# Patient Record
Sex: Male | Born: 1980 | Race: White | Hispanic: No | Marital: Married | Smoking: Never smoker
Health system: Southern US, Community
[De-identification: ages and names within clinical notes are randomized; demographics above are authoritative.]

## PROBLEM LIST (undated history)

## (undated) DIAGNOSIS — A5143 Secondary syphilitic oculopathy: Secondary | ICD-10-CM

## (undated) DIAGNOSIS — B20 Human immunodeficiency virus [HIV] disease: Principal | ICD-10-CM

## (undated) HISTORY — DX: Human immunodeficiency virus (HIV) disease: B20

## (undated) HISTORY — PX: WISDOM TOOTH EXTRACTION: SHX21

## (undated) HISTORY — DX: Secondary syphilitic oculopathy: A51.43

---

## 2017-02-01 DIAGNOSIS — B20 Human immunodeficiency virus [HIV] disease: Secondary | ICD-10-CM | POA: Insufficient documentation

## 2017-02-12 ENCOUNTER — Encounter (HOSPITAL_COMMUNITY): Payer: Self-pay | Admitting: General Practice

## 2017-02-12 ENCOUNTER — Inpatient Hospital Stay (HOSPITAL_COMMUNITY)
Admission: AD | Admit: 2017-02-12 | Discharge: 2017-02-14 | DRG: 124 | Disposition: A | Discharge status: Home Health | Payer: BLUE CROSS/BLUE SHIELD | Source: Ambulatory Visit | Attending: Internal Medicine | Admitting: Internal Medicine

## 2017-02-12 ENCOUNTER — Other Ambulatory Visit (HOSPITAL_COMMUNITY)
Admission: RE | Admit: 2017-02-12 | Discharge: 2017-02-12 | Disposition: A | Discharge status: Home / Self Care | Payer: BLUE CROSS/BLUE SHIELD | Source: Ambulatory Visit | Attending: Infectious Disease | Admitting: Infectious Disease

## 2017-02-12 ENCOUNTER — Inpatient Hospital Stay (HOSPITAL_COMMUNITY): Payer: BLUE CROSS/BLUE SHIELD

## 2017-02-12 ENCOUNTER — Other Ambulatory Visit: Payer: Self-pay | Admitting: Pharmacist

## 2017-02-12 ENCOUNTER — Ambulatory Visit (INDEPENDENT_AMBULATORY_CARE_PROVIDER_SITE_OTHER): Payer: BLUE CROSS/BLUE SHIELD | Admitting: Infectious Disease

## 2017-02-12 ENCOUNTER — Encounter: Payer: Self-pay | Admitting: Infectious Disease

## 2017-02-12 VITALS — BP 137/89 | HR 59 | Temp 98.6°F | Ht 72.0 in | Wt 190.0 lb

## 2017-02-12 DIAGNOSIS — A5143 Secondary syphilitic oculopathy: Secondary | ICD-10-CM | POA: Diagnosis not present

## 2017-02-12 DIAGNOSIS — H109 Unspecified conjunctivitis: Secondary | ICD-10-CM | POA: Diagnosis present

## 2017-02-12 DIAGNOSIS — R21 Rash and other nonspecific skin eruption: Secondary | ICD-10-CM | POA: Diagnosis present

## 2017-02-12 DIAGNOSIS — B20 Human immunodeficiency virus [HIV] disease: Secondary | ICD-10-CM | POA: Diagnosis not present

## 2017-02-12 DIAGNOSIS — Z79899 Other long term (current) drug therapy: Secondary | ICD-10-CM | POA: Diagnosis not present

## 2017-02-12 DIAGNOSIS — Z21 Asymptomatic human immunodeficiency virus [HIV] infection status: Secondary | ICD-10-CM | POA: Diagnosis not present

## 2017-02-12 DIAGNOSIS — L989 Disorder of the skin and subcutaneous tissue, unspecified: Secondary | ICD-10-CM | POA: Diagnosis present

## 2017-02-12 DIAGNOSIS — Z95828 Presence of other vascular implants and grafts: Secondary | ICD-10-CM | POA: Diagnosis not present

## 2017-02-12 DIAGNOSIS — H209 Unspecified iridocyclitis: Secondary | ICD-10-CM | POA: Diagnosis present

## 2017-02-12 DIAGNOSIS — R509 Fever, unspecified: Secondary | ICD-10-CM

## 2017-02-12 HISTORY — DX: Human immunodeficiency virus (HIV) disease: B20

## 2017-02-12 HISTORY — DX: Secondary syphilitic oculopathy: A51.43

## 2017-02-12 LAB — TSH: TSH: 0.226 u[IU]/mL — ABNORMAL LOW (ref 0.350–4.500)

## 2017-02-12 LAB — CBC WITH DIFFERENTIAL/PLATELET
BASOS PCT: 1 %
Basophils Absolute: 111 cells/uL (ref 0–200)
EOS PCT: 2 %
Eosinophils Absolute: 222 cells/uL (ref 15–500)
HCT: 43.3 % (ref 38.5–50.0)
Hemoglobin: 14.3 g/dL (ref 13.2–17.1)
LYMPHS PCT: 43 %
Lymphs Abs: 4773 cells/uL — ABNORMAL HIGH (ref 850–3900)
MCH: 26.9 pg — AB (ref 27.0–33.0)
MCHC: 33 g/dL (ref 32.0–36.0)
MCV: 81.5 fL (ref 80.0–100.0)
MONOS PCT: 7 %
MPV: 8.7 fL (ref 7.5–12.5)
Monocytes Absolute: 777 cells/uL (ref 200–950)
NEUTROS ABS: 5217 {cells}/uL (ref 1500–7800)
Neutrophils Relative %: 47 %
PLATELETS: 227 10*3/uL (ref 140–400)
RBC: 5.31 MIL/uL (ref 4.20–5.80)
RDW: 13.9 % (ref 11.0–15.0)
WBC: 11.1 10*3/uL — ABNORMAL HIGH (ref 3.8–10.8)

## 2017-02-12 LAB — URINALYSIS, ROUTINE W REFLEX MICROSCOPIC
Bilirubin Urine: NEGATIVE
GLUCOSE, UA: NEGATIVE mg/dL
Hgb urine dipstick: NEGATIVE
KETONES UR: NEGATIVE mg/dL
LEUKOCYTES UA: NEGATIVE
Nitrite: NEGATIVE
PH: 6 (ref 5.0–8.0)
Protein, ur: NEGATIVE mg/dL
Specific Gravity, Urine: 1.006 (ref 1.005–1.030)

## 2017-02-12 LAB — COMPREHENSIVE METABOLIC PANEL
ALK PHOS: 59 U/L (ref 38–126)
ALT: 13 U/L — AB (ref 17–63)
AST: 18 U/L (ref 15–41)
Albumin: 4 g/dL (ref 3.5–5.0)
Anion gap: 9 (ref 5–15)
BUN: 9 mg/dL (ref 6–20)
CALCIUM: 8.4 mg/dL — AB (ref 8.9–10.3)
CO2: 25 mmol/L (ref 22–32)
CREATININE: 0.9 mg/dL (ref 0.61–1.24)
Chloride: 96 mmol/L — ABNORMAL LOW (ref 101–111)
Glucose, Bld: 122 mg/dL — ABNORMAL HIGH (ref 65–99)
Potassium: 3.4 mmol/L — ABNORMAL LOW (ref 3.5–5.1)
Sodium: 130 mmol/L — ABNORMAL LOW (ref 135–145)
Total Bilirubin: 0.5 mg/dL (ref 0.3–1.2)
Total Protein: 6.9 g/dL (ref 6.5–8.1)

## 2017-02-12 LAB — CBC
HCT: 39.9 % (ref 39.0–52.0)
HEMOGLOBIN: 13.5 g/dL (ref 13.0–17.0)
MCH: 27.8 pg (ref 26.0–34.0)
MCHC: 33.8 g/dL (ref 30.0–36.0)
MCV: 82.1 fL (ref 78.0–100.0)
Platelets: 204 10*3/uL (ref 150–400)
RBC: 4.86 MIL/uL (ref 4.22–5.81)
RDW: 12.5 % (ref 11.5–15.5)
WBC: 11.5 10*3/uL — ABNORMAL HIGH (ref 4.0–10.5)

## 2017-02-12 LAB — MAGNESIUM: Magnesium: 1.8 mg/dL (ref 1.7–2.4)

## 2017-02-12 MED ORDER — ACETAMINOPHEN 650 MG RE SUPP: 650.0000 mg | Freq: Four times a day (QID) | RECTAL | Status: DC | PRN

## 2017-02-12 MED ORDER — SODIUM CHLORIDE 0.9 % IV SOLN
INTRAVENOUS | Status: DC
  Administered 2017-02-12 – 2017-02-13 (×2): via INTRAVENOUS

## 2017-02-12 MED ORDER — SODIUM CHLORIDE 0.9% FLUSH
3.0000 mL | Freq: Two times a day (BID) | INTRAVENOUS | Status: DC
  Administered 2017-02-12 – 2017-02-13 (×2): 3 mL via INTRAVENOUS

## 2017-02-12 MED ORDER — PENICILLIN G POTASSIUM 5000000 UNITS IJ SOLR
4.0000 10*6.[IU] | INTRAMUSCULAR | Status: DC
  Administered 2017-02-12 – 2017-02-14 (×10): 4 10*6.[IU] via INTRAVENOUS
  Filled 2017-02-12 (×16): qty 4

## 2017-02-12 MED ORDER — DOLUTEGRAVIR SODIUM 50 MG PO TABS
50.0000 mg | ORAL_TABLET | Freq: Every day | ORAL | Status: DC
  Administered 2017-02-12 – 2017-02-14 (×3): 50 mg via ORAL
  Filled 2017-02-12 (×3): qty 1

## 2017-02-12 MED ORDER — ENOXAPARIN SODIUM 40 MG/0.4ML ~~LOC~~ SOLN
40.0000 mg | SUBCUTANEOUS | Status: DC
  Administered 2017-02-12 – 2017-02-13 (×2): 40 mg via SUBCUTANEOUS
  Filled 2017-02-12 (×2): qty 0.4

## 2017-02-12 MED ORDER — DOLUTEGRAVIR SODIUM 50 MG PO TABS: 50.0000 mg | ORAL_TABLET | Freq: Every day | ORAL | 5 refills | Status: DC

## 2017-02-12 MED ORDER — ACETAMINOPHEN 325 MG PO TABS: 650.0000 mg | ORAL_TABLET | Freq: Four times a day (QID) | ORAL | Status: DC | PRN

## 2017-02-12 MED ORDER — SODIUM CHLORIDE 0.9% FLUSH
10.0000 mL | INTRAVENOUS | Status: DC | PRN
  Administered 2017-02-13 – 2017-02-14 (×2): 10 mL
  Filled 2017-02-12 (×2): qty 40

## 2017-02-12 MED ORDER — EMTRICITABINE-TENOFOVIR AF 200-25 MG PO TABS
1.0000 | ORAL_TABLET | Freq: Every day | ORAL | Status: DC
  Administered 2017-02-12 – 2017-02-13 (×2): 1 via ORAL
  Filled 2017-02-12 (×3): qty 1

## 2017-02-12 MED ORDER — ONDANSETRON HCL 4 MG/2ML IJ SOLN: 4.0000 mg | Freq: Four times a day (QID) | INTRAMUSCULAR | Status: DC | PRN

## 2017-02-12 MED ORDER — EMTRICITABINE-TENOFOVIR AF 200-25 MG PO TABS: 1.0000 | ORAL_TABLET | Freq: Every day | ORAL | 5 refills | Status: DC

## 2017-02-12 NOTE — Progress Notes (Signed)
Peripherally Inserted Central Catheter/Midline Placement  The IV Nurse has discussed with the patient and/or persons authorized to consent for the patient, the purpose of this procedure and the potential benefits and risks involved with this procedure.  The benefits include less needle sticks, lab draws from the catheter, and the patient may be discharged home with the catheter. Risks include, but not limited to, infection, bleeding, blood clot (thrombus formation), and puncture of an artery; nerve damage and irregular heartbeat and possibility to perform a PICC exchange if needed/ordered by physician.  Alternatives to this procedure were also discussed.  Bard Power PICC patient education guide, fact sheet on infection prevention and patient information card has been provided to patient /or left at bedside.    PICC/Midline Placement Documentation        Lisabeth Devoid 02/12/2017, 6:15 PM Consent obtained by Arlina Robes, RN

## 2017-02-12 NOTE — H&P (Addendum)
[facility] History and Physical  Dalton Rodriguez ZOX:[id number] DOB: 11/20/1980 DOA: 02/12/2017  Referring physician:   PCP: [facility]   Chief Complaint:    HPI:  36 year old homosexual male, seen by Dr. Algis Liming at the new HIV patient, admitted today from infectious disease office for treatment of newly diagnosed syphilitic uveitis and newly diagnosed HIV. Patient has been having cloudy vision since December, with occasional floaters. He also has a rash on his back for the last 2 weeks. He denies any fever, chills. Denies any dysphagia or difficulty swallowing. Denies any urinary symptoms or discharge. Patient admitted as per ID recommendations,He needs PICC and IV continous Penicillin x 2 weeks.       Review of Systems: negative for the following  Constitutional: Denies fever, chills, diaphoresis, appetite change and fatigue.  HEENT: Eyes: Positive for pain and visual disturbance, redness, hearing loss, ear pain, congestion, sore throat, rhinorrhea, sneezing, mouth sores, trouble swallowing, neck pain, neck stiffness and tinnitus.  Respiratory: Denies SOB, DOE, cough, chest tightness, and wheezing.  Cardiovascular: Denies chest pain, palpitations and leg swelling.  Gastrointestinal: Denies nausea, vomiting, abdominal pain, diarrhea, constipation, blood in stool and abdominal distention.  Genitourinary: Denies dysuria, urgency, frequency, hematuria, flank pain and difficulty urinating.  Musculoskeletal: Denies myalgias, back pain, joint swelling, arthralgias and gait problem.  Skin: Denies pallor, rash and wound.  Neurological: Denies dizziness, seizures, syncope, weakness, light-headedness, numbness and headaches.  Hematological: Denies adenopathy. Easy bruising, personal or family bleeding history  Psychiatric/Behavioral: Denies suicidal ideation, mood changes, confusion, nervousness, sleep disturbance and agitation       Past Medical History:  Diagnosis Date   . HIV disease (HCC) 02/12/2017  . 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 WISDOM TOOTH EXTRACTION        Social History:  reports that he has never smoked. He has never used smokeless tobacco. He reports that he drinks alcohol. He reports that he does not use drugs.    No Known Allergies      FAMILY HISTORY  When questioned  Directly-patient reports  No family history of HTN, CVA ,DIABETES, TB, Cancer CAD, Bleeding Disorders, Sickle Cell, diabetes, anemia, asthma,   Prior to Admission medications   Medication Sig Start Date End Date Taking? Authorizing Provider  dolutegravir (TIVICAY) 50 MG tablet Take 1 tablet (50 mg total) by mouth daily. 02/12/17   Cassie L Kuppelweiser, RPH  DUREZOL 0.05 % EMUL  01/27/17   Historical Provider, MD  emtricitabine-tenofovir AF (DESCOVY) 200-25 MG tablet Take 1 tablet by mouth daily. 02/12/17   Cassie L Kuppelweiser, RPH     Physical Exam: Vitals:   02/12/17 1712  BP: (!) 134/93  Pulse: (!) 113  Resp: 17  Temp: (!) 100.7 F (38.2 C)  TempSrc: Oral  SpO2: 100%  Weight: 85.8 kg (189 lb 3.2 oz)  Height: 6' (1.829 m)        Vitals:   02/12/17 1712  BP: (!) 134/93  Pulse: (!) 113  Resp: 17  Temp: (!) 100.7 F (38.2 C)  TempSrc: Oral  SpO2: 100%  Weight: 85.8 kg (189 lb 3.2 oz)  Height: 6' (1.829 m)   Constitutional: NAD, calm, comfortable Eyes: PERRL, lids and conjunctivae normal ENMT: Mucous membranes are moist. Posterior pharynx clear of any exudate or lesions.Normal dentition.  Neck: normal, supple, no masses, no thyromegaly Respiratory: clear to auscultation bilaterally, no wheezing, no crackles. Normal respiratory effort. No accessory muscle use.  Cardiovascular: Regular rate and rhythm, no murmurs / rubs / gallops. No extremity edema. 2+ pedal pulses. No carotid bruits.  Abdomen: no tenderness, no masses palpated. No hepatosplenomegaly. Bowel sounds positive.   Musculoskeletal: no clubbing / cyanosis. No joint deformity upper and lower extremities. Good ROM, no contractures. Normal muscle tone.  Skin: Rash noted., lesions, ulcers. No induration Neurologic: CN 2-12 grossly intact. Sensation intact, DTR normal. Strength 5/5 in all 4.  Psychiatric: Normal judgment and insight. Alert and oriented x 3. Normal mood.     Labs on Admission: I have personally reviewed following labs and imaging studies  CBC: No results for input(s): WBC, NEUTROABS, HGB, HCT, MCV, PLT in the last 168 hours.  Basic Metabolic Panel: No results for input(s): NA, K, CL, CO2, GLUCOSE, BUN, CREATININE, CALCIUM, MG, PHOS in the last 168 hours.  GFR: CrCl cannot be calculated (No order found.).  Liver Function Tests: No results for input(s): AST, ALT, ALKPHOS, BILITOT, PROT, ALBUMIN in the last 168 hours. No results for input(s): LIPASE, AMYLASE in the last 168 hours. No results for input(s): AMMONIA in the last 168 hours.  Coagulation Profile: No results for input(s): INR, PROTIME in the last 168 hours. No results for input(s): DDIMER in the last 72 hours.  Cardiac Enzymes: No results for input(s): CKTOTAL, CKMB, CKMBINDEX, TROPONINI in the last 168 hours.  BNP (last 3 results) No results for input(s): PROBNP in the last 8760 hours.  HbA1C: No results for input(s): HGBA1C in the last 72 hours. No results found for: HGBA1C   CBG: No results for input(s): GLUCAP in the last 168 hours.  Lipid Profile: No results for input(s): CHOL, HDL, LDLCALC, TRIG, CHOLHDL, LDLDIRECT in the last 72 hours.  Thyroid Function Tests: No results for input(s): TSH, T4TOTAL, FREET4, T3FREE, THYROIDAB in the last 72 hours.  Anemia Panel: No results for input(s): VITAMINB12, FOLATE, FERRITIN, TIBC, IRON, RETICCTPCT in the last 72 hours.  Urine analysis: No results found for: COLORURINE, APPEARANCEUR, LABSPEC, PHURINE, GLUCOSEU, HGBUR, BILIRUBINUR, KETONESUR, PROTEINUR,  UROBILINOGEN, NITRITE, LEUKOCYTESUR  Sepsis Labs: (procalcitonin:4,lacticidven:4) )No results found for this or any previous visit (from the past 240 hour(s)).       Radiological Exams on Admission: No results found. No results found.    EKG: Independently reviewed.    Assessment/Plan Principal Problem:   Syphilitic uveitis Infectious disease has ordered PICC line placement ID has also ordered penicillin 2 weeks We'll also check for gonorrhea and chlamydia      HIV disease (HCC)-check CD4 count, HIV RNA genotype, viral load, hepatitis panel     DVT prophylaxis: * Lovenox     Code Status Orders Full code        consults called:  Family Communication: Admission, patients condition and plan of care including tests being ordered have been discussed with the patient  who indicates understanding and agree with the plan and Code Status  Admission status: inpatient    Disposition plan: Further plan will depend as patient's clinical course evolves and further radiologic and laboratory data become available. Likely home when stable   At the time of admission, it appears that the appropriate admission status for this patient is INPATIENT .Thisis judged to be reasonable and necessary in order to provide the required intensity of service to ensure the patient's safetygiven thepresenting symptoms, physical exam findings, and initial radiographic and laboratory data in the context of their chronic comorbidities.   Richarda Overlie MD [facility] Pager [phone]  If 7PM-7AM, please  contact night-coverage [email or web address] Password [facility]  02/12/2017, 5:58 PM

## 2017-02-12 NOTE — Progress Notes (Signed)
Chief complaint: blurry vision  Subjective:    Patient ID: Dalton Rodriguez, male    DOB: 1981-07-31, 36 y.o.   MRN: [id number]  HPI  36 year old Caucasian male with newly diagnosed syphilitic uveitis and newly diagnosed HIV. Patient tells me that he had been suffering from waxing and waning cloudy vision since December. He has pain ion OS>OD. + occasional floaters that subsided. 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 He was worked up with labs and RPR + 1:128 and HIV antibody positive.   He acquired HIV from sex with other men. NO history of IVDU. He had rash that was pruritic and scalng. He also had rash on his back does not recall painless ulcer.   I worked him in urgently so that we could initiate treatment for uveitis = same as for Neurosyphilis with IV PCN.   Past Medical History:  Diagnosis Date  . HIV disease ([place]) 02/12/2017  . 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  No family history on file.    Social History   Social History  . Marital status: Married    Spouse name: N/A  . Number of children: N/A  . Years of education: N/A   Social History Main Topics  . Smoking status: Never Smoker  . Smokeless tobacco: Never Used  . Alcohol use None     Comment: 3-4 drinks a week  . Drug use: No  . Sexual activity: Yes    Partners: Male    Birth control/ protection: Condom   Other Topics Concern  . None   Social History Narrative  . None    No Known Allergies   Current Outpatient Prescriptions:  .  predniSONE (DELTASONE) 20 MG tablet, Take 2 tablets by mouth daily with breakfast for 5 days., Disp: , Rfl:  .  dolutegravir (TIVICAY) 50 MG tablet, Take 1 tablet (50 mg total) by mouth daily., Disp: 30 tablet, Rfl: 5 .  DUREZOL 0.05 % EMUL, , Disp: , Rfl: 0 .  emtricitabine-tenofovir AF (DESCOVY) 200-25 MG tablet, Take 1 tablet by mouth daily., Disp: 30 tablet, Rfl: 5   Review of Systems  Constitutional: Negative for  chills and fever.  HENT: Negative for congestion and sore throat.   Eyes: Positive for pain and visual disturbance. Negative for photophobia.  Respiratory: Negative for cough, shortness of breath and wheezing.   Cardiovascular: Negative for chest pain, palpitations and leg swelling.  Gastrointestinal: Negative for abdominal pain, blood in stool, constipation, diarrhea, nausea and vomiting.  Genitourinary: Negative for dysuria, flank pain and hematuria.  Musculoskeletal: Negative for back pain and myalgias.  Skin: Positive for rash.  Neurological: Negative for dizziness, weakness and headaches.  Hematological: Does not bruise/bleed easily.  Psychiatric/Behavioral: Negative for suicidal ideas.       Objective:   Physical Exam  Constitutional: He is oriented to person, place, and time. He appears well-developed and well-nourished.  HENT:  Head: Normocephalic and atraumatic.  Eyes: Conjunctivae and EOM are normal.  Neck: Normal range of motion. Neck supple.  Cardiovascular: Normal rate, regular rhythm and intact distal pulses.  Exam reveals no gallop and no friction rub.   No murmur heard. Pulmonary/Chest: Effort normal and breath sounds normal. No respiratory distress. He has no wheezes. He has no rales. He exhibits no tenderness.  Abdominal: Soft. Bowel sounds are normal. He exhibits no distension. There is no rebound.  Musculoskeletal: Normal range of motion. He exhibits no edema  or tenderness.  Neurological: He is alert and oriented to person, place, and time.  Skin: Skin is warm and dry. Rash noted. No erythema. No pallor.  Psychiatric: He has a normal mood and affect. His behavior is normal. Judgment and thought content normal.  Vitals reviewed.  Rash with heaped up borders lesions c/w with syphilis:           Assessment & Plan:   Syphilitic uveitis: I dont see strong reason for LP. Treatment is the same. He needs PICC and IV continous Penicillin x 2 weeks  Test for GC  and chlamydia oral rectal and urine  HIV disease: check genotype, viral load, hep panel, HLA basic labs.   Will start on Tivicay and Descovy in house, fill meds at [facility]  Will address vaccines at future visit  We spent greater than 60 minutes with the patient including greater than 50% of time in face to face counsel of the patient re his HIV disease, Syphilitic uveitis, STIS ARV regimen  and in coordination of his care.

## 2017-02-12 NOTE — Progress Notes (Signed)
HPI: Dalton Rodriguez is a 36 y.o. male who presents to the [facility] to see Dr. Daiva Eves as a new HIV patient.   Allergies: No Known Allergies  Past Medical History: Past Medical History:  Diagnosis Date  . HIV disease (HCC) 02/12/2017  . 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 Marital status: Married    Spouse name: N/A  . Number of children: N/A  . Years of education: N/A   Social History Main Topics  . Smoking status: Never Smoker  . Smokeless tobacco: Never Used  . Alcohol use None     Comment: 3-4 drinks a week  . Drug use: No  . Sexual activity: Yes    Partners: Male    Birth control/ protection: Condom   Other Topics Concern  . None   Social History Narrative  . None    Current Regimen: None  Labs: No results found for: HIV1RNAQUANT, HIV1RNAVL, CD4TABS, HEPBSAB, HEPBSAG, HCVAB  CrCl: CrCl cannot be calculated (No order found.).  Lipids: No results found for: CHOL, TRIG, HDL, CHOLHDL, VLDL, LDLCALC  Assessment: Judge is here today to initiate care for his HIV.  Dr. Daiva Eves asked me to speak with him about different medication options.  I talked to him a little bit about HIV and explained the different treatment regimens on the market.  I discussed Narda Rutherford, Odefsey, and Biktarvy.  I also discussed Tivicay and Descovy. He has [employer] and Susanne Borders is not covered by that insurance just yet.  He will start Tivicay and Descovy and transition to [place] once on BCBS formulary. Will try to fill at [facility] but most of the time, BCBS requires specialty pharmacy.  Will send to [facility] in [place] if so.   Plans: - Tivicay 50 mg PO once daily - Descovy 200-25 mg PO once daily  Khalee Mazo L. Ahonesty Woodfin, PharmD, CPP Infectious Diseases Clinical Pharmacist [facility] 02/12/2017, 2:31 PM

## 2017-02-13 DIAGNOSIS — Z95828 Presence of other vascular implants and grafts: Secondary | ICD-10-CM

## 2017-02-13 DIAGNOSIS — B20 Human immunodeficiency virus [HIV] disease: Secondary | ICD-10-CM

## 2017-02-13 DIAGNOSIS — Z21 Asymptomatic human immunodeficiency virus [HIV] infection status: Secondary | ICD-10-CM

## 2017-02-13 DIAGNOSIS — A5143 Secondary syphilitic oculopathy: Principal | ICD-10-CM

## 2017-02-13 LAB — COMPLETE METABOLIC PANEL WITH GFR
ALBUMIN: 4.4 g/dL (ref 3.6–5.1)
ALK PHOS: 67 U/L (ref 40–115)
ALT: 10 U/L (ref 9–46)
AST: 14 U/L (ref 10–40)
BILIRUBIN TOTAL: 0.5 mg/dL (ref 0.2–1.2)
BUN: 12 mg/dL (ref 7–25)
CALCIUM: 8.9 mg/dL (ref 8.6–10.3)
CO2: 23 mmol/L (ref 20–31)
CREATININE: 0.96 mg/dL (ref 0.60–1.35)
Chloride: 98 mmol/L (ref 98–110)
GFR, Est African American: >89 mL/min (ref >=60)
Glucose, Bld: 104 mg/dL — ABNORMAL HIGH (ref 65–99)
Potassium: 3.8 mmol/L (ref 3.5–5.3)
Sodium: 134 mmol/L — ABNORMAL LOW (ref 135–146)
TOTAL PROTEIN: 7.4 g/dL (ref 6.1–8.1)

## 2017-02-13 LAB — GC/CHLAMYDIA PROBE AMP (~~LOC~~) NOT AT ARMC
Chlamydia: NEGATIVE
NEISSERIA GONORRHEA: NEGATIVE

## 2017-02-13 LAB — CBC
HEMATOCRIT: 38.1 % — AB (ref 39.0–52.0)
HEMOGLOBIN: 12.7 g/dL — AB (ref 13.0–17.0)
MCH: 27.4 pg (ref 26.0–34.0)
MCHC: 33.3 g/dL (ref 30.0–36.0)
MCV: 82.1 fL (ref 78.0–100.0)
Platelets: 210 10*3/uL (ref 150–400)
RBC: 4.64 MIL/uL (ref 4.22–5.81)
RDW: 12.9 % (ref 11.5–15.5)
WBC: 11.3 10*3/uL — ABNORMAL HIGH (ref 4.0–10.5)

## 2017-02-13 LAB — HEPATITIS B SURFACE ANTIGEN: Hepatitis B Surface Ag: NEGATIVE

## 2017-02-13 LAB — HEPATITIS C ANTIBODY: HCV Ab: NEGATIVE

## 2017-02-13 LAB — T-HELPER CELL (CD4) - (RCID CLINIC ONLY)
CD4 T CELL ABS: 560 /uL (ref 400–2700)
CD4 T CELL HELPER: 11 % — AB (ref 33–55)

## 2017-02-13 LAB — HEPATITIS A ANTIBODY, TOTAL: Hep A Total Ab: NONREACTIVE

## 2017-02-13 LAB — HEPATITIS B SURFACE ANTIBODY,QUALITATIVE: HEP B S AB: POSITIVE — AB

## 2017-02-13 MED ORDER — PENICILLIN G POTASSIUM IV (FOR PTA / DISCHARGE USE ONLY): 24.0000 10*6.[IU] | INTRAVENOUS | 0 refills | Status: DC

## 2017-02-13 MED FILL — DESCOVY 200-25 MG TABS: 200-25 | 30 days supply | Qty: 30 | Fill #0

## 2017-02-13 NOTE — Addendum Note (Signed)
Addended by: Mariea Clonts D on: 02/13/2017 09:38 AM   Modules accepted: Orders

## 2017-02-13 NOTE — Progress Notes (Signed)
[facility]  New pt for [facility] this hospital admission.  [facility] will provide [facility] services for home IV ABX.  If patient discharges after hours, please call [phone].   Sedalia Muta 02/13/2017, 12:20 PM

## 2017-02-13 NOTE — Progress Notes (Addendum)
PHARMACY CONSULT NOTE FOR:  OUTPATIENT  PARENTERAL ANTIBIOTIC THERAPY (OPAT)  Indication: ocular syphilis Regimen: penicillin G IV 24 million units continuous over 24 hours End date: 02/26/2017 to complete 14 full days  IV antibiotic discharge orders are pended. To discharging provider:  please sign these orders via discharge navigator,  Select New Orders & click on the button choice - Manage This Unsigned Work.     Thank you for allowing pharmacy to be a part of this patient's care.  Reonna Finlayson D. Cheston Coury, PharmD, BCPS Clinical Pharmacist Pager: [phone] 02/13/2017 2:47 PM

## 2017-02-13 NOTE — Care Management Note (Addendum)
Case Management Note  Patient Details  Name: Dalton Rodriguez MRN: [id number] Date of Birth: Mar 16, 1981  Subjective/Objective:      CM following for progression and d/c planning.               Action/Plan: 02/13/2017 Met with pt re IV antibiotics to be administered at home. This CM explained plan to pt and contacted Carolynn Sayers of [facility], who will see this pt today to arrange for [facility] and IV antibiotics. Await prescriptions.  4pm pt has received HIV meds, IV Penicillin arranged by Harriet [place] with [facility] to provide teaching . Pt ready for d/c to home.  Expected Discharge Date:    02/13/2017              Expected Discharge Plan:  [place]  In-House Referral:  Clinical Social Work  Discharge planning Services  CM Consult  Post Acute Care Choice:  Durable Medical Equipment, Home Health Choice offered to:  Patient  DME Arranged:  IV pump/equipment DME Agency:  [place] Arranged:  RN [facility] Agency:  [place]  Status of Service:  Completed, signed off  If discussed at [place] of Stay Meetings, dates discussed:    Additional Comments:  Adron Bene, RN 02/13/2017, 11:40 AM

## 2017-02-13 NOTE — Progress Notes (Signed)
PROGRESS NOTE    Dalton Rodriguez  ZOX:[id number] DOB: Sep 20, 1981 DOA: 02/12/2017 PCP: [facility]    Brief Narrative:  36 yo male with new diagnosis HIV, complicated with syphilis uveitis. Initially evaluated at the outpatient ID office, referred for IV antibiotic therapy. Positive skin lesions at the upper back. HIV viral load and genotyping pending.    Assessment & Plan:   Principal Problem:   Syphilitic uveitis Active Problems:   HIV disease (HCC)   Uveitis   1. Syphilitic uveitis. Patient has been started on IV penicillin, consulted pharmacy for outpatient antibiotic therapy. Will need 2 weeks continuous therapy. Clinically patient with persistent uveitis.   2. New diagnosed HIV (AIDS). Negative for chlamydia and neisseria. Will continue antiretroviral agents, with tivicay and descovy. Social services consulted for outpatient follow up.    DVT prophylaxis: enoxaparin  Code Status: full  Family Communication: no family at the bedside Disposition Plan: home   Consultants:   Infectious disease  Procedures:     Antimicrobials:   Penicillin   descovy  Tivicay   Subjective: Patient feeling better, no blurry vision, no nausea or vomiting. Picc line has been placed.   Objective: Vitals:   02/12/17 1712 02/12/17 2025 02/13/17 0448 02/13/17 0859  BP: (!) 134/93 130/82 125/65 119/74  Pulse: (!) 113 (!) 113 (!) 118 (!) 101  Resp: Temp: (!) 100.7 F (38.2 C) 98.7 F (37.1 C) 98.8 F (37.1 C) 98.1 F (36.7 C)  TempSrc: Oral Axillary Oral Oral  SpO2: 100% 98% 96% 97%  Weight: 85.8 kg (189 lb 3.2 oz) 85.9 kg (189 lb 6 oz)    Height: 6' (1.829 m)       Intake/Output Summary (Last 24 hours) at 02/13/17 1203 Last data filed at 02/13/17 1146  Gross per 24 hour  Intake              130 ml  Output             2425 ml  Net            -2295 ml   Filed Weights   02/12/17 1712 02/12/17 2025  Weight: 85.8 kg (189 lb 3.2 oz) 85.9 kg (189 lb 6  oz)    Examination:  General exam: not in pain or dyspnea E ENT: no pallor or icterus. Positive conjunctival erythema bilateral.  Respiratory system: Clear to auscultation. Respiratory effort normal. Cardiovascular system: S1 & S2 heard, RRR. No JVD, murmurs, rubs, gallops or clicks. No pedal edema. Gastrointestinal system: Abdomen is nondistended, soft and nontender. No organomegaly or masses felt. Normal bowel sounds heard. Central nervous system: Alert and oriented. No focal neurological deficits. Extremities: Symmetric 5 x 5 power. Skin: plaques at the upper back, well defined margin. No erythema or purulence.      Data Reviewed: I have personally reviewed following labs and imaging studies  CBC:  Recent Labs Lab 02/12/17 1523 02/12/17 1916 02/13/17 0436  WBC 11.1* 11.5* 11.3*  NEUTROABS 5,217  --   --   HGB 14.3 13.5 12.7*  HCT 43.3 39.9 38.1*  MCV 81.5 82.1 82.1  PLT 227 204 210   Basic Metabolic Panel:  Recent Labs Lab 02/12/17 1523 02/12/17 1906 02/12/17 1916  NA 134*  --  130*  K 3.8  --  3.4*  CL 98  --  96*  CO2 23  --  25  GLUCOSE 104*  --  122*  BUN 12  --  9  CREATININE 0.96  --  0.90  CALCIUM 8.9  --  8.4*  MG  --  1.8  --    GFR: Estimated Creatinine Clearance: 126.9 mL/min (by C-G formula based on SCr of 0.9 mg/dL). Liver Function Tests:  Recent Labs Lab 02/12/17 1523 02/12/17 1916  AST 14 18  ALT 10 13*  ALKPHOS 67 59  BILITOT 0.5 0.5  PROT 7.4 6.9  ALBUMIN 4.4 4.0   No results for input(s): LIPASE, AMYLASE in the last 168 hours. No results for input(s): AMMONIA in the last 168 hours. Coagulation Profile: No results for input(s): INR, PROTIME in the last 168 hours. Cardiac Enzymes: No results for input(s): CKTOTAL, CKMB, CKMBINDEX, TROPONINI in the last 168 hours. BNP (last 3 results) No results for input(s): PROBNP in the last 8760 hours. HbA1C: No results for input(s): HGBA1C in the last 72 hours. CBG: No results for  input(s): GLUCAP in the last 168 hours. Lipid Profile: No results for input(s): CHOL, HDL, LDLCALC, TRIG, CHOLHDL, LDLDIRECT in the last 72 hours. Thyroid Function Tests:  Recent Labs  02/12/17 1907  TSH 0.226*   Anemia Panel: No results for input(s): VITAMINB12, FOLATE, FERRITIN, TIBC, IRON, RETICCTPCT in the last 72 hours. Sepsis Labs: No results for input(s): PROCALCITON, LATICACIDVEN in the last 168 hours.  Recent Results (from the past 240 hour(s))  Culture, blood (routine x 2)     Status: None (Preliminary result)   Collection Time: 02/12/17  7:07 PM  Result Value Ref Range Status   Specimen Description BLOOD RIGHT ANTECUBITAL  Final   Special Requests   Final    BOTTLES DRAWN AEROBIC AND ANAEROBIC Blood Culture adequate volume   Culture NO GROWTH < 24 HOURS  Final   Report Status PENDING  Incomplete  Culture, blood (routine x 2)     Status: None (Preliminary result)   Collection Time: 02/12/17  7:17 PM  Result Value Ref Range Status   Specimen Description BLOOD LEFT HAND  Final   Special Requests   Final    BOTTLES DRAWN AEROBIC AND ANAEROBIC Blood Culture adequate volume   Culture NO GROWTH < 24 HOURS  Final   Report Status PENDING  Incomplete         Radiology Studies: Portable Chest 1 View  Result Date: 02/12/2017 CLINICAL DATA:  36 y/o  M; fever.  PICC placement. EXAM: PORTABLE CHEST 1 VIEW COMPARISON:  None. FINDINGS: Right PICC line tip projects over the lower SVC. Normal cardiac silhouette given projection and technique. Clear lungs. No pleural effusion or pneumothorax. No acute osseous abnormality is evident. IMPRESSION: No active disease.  PICC line tip projects over lower SVC. Electronically Signed   By: Mitzi Hansen M.D.   On: 02/12/2017 22:27        Scheduled Meds: . dolutegravir  50 mg Oral Daily  . emtricitabine-tenofovir AF  1 tablet Oral Daily  . enoxaparin (LOVENOX) injection  40 mg Subcutaneous Q24H  . pencillin G potassium IV   4 Million Units Intravenous Q4H  . sodium chloride flush  3 mL Intravenous Q12H   Continuous Infusions: . sodium chloride 75 mL/hr at 02/12/17 1900     LOS: 1 day       Roneshia Drew Annett Gula, MD [facility] Pager [phone]  If 7PM-7AM, please contact night-coverage [email or web address] Password [facility] 02/13/2017, 12:03 PM

## 2017-02-13 NOTE — Addendum Note (Signed)
Addended by: Mariea Clonts D on: 02/13/2017 09:28 AM   Modules accepted: Orders

## 2017-02-13 NOTE — Progress Notes (Signed)
Subjective: No new complaints, he believes the vision is slightly better.   Antibiotics:  Anti-infectives    Start     Dose/Rate Route Frequency Ordered Stop   02/13/17 0000  penicillin G IVPB     24 Million Units Intravenous Continuous 02/13/17 1516     02/12/17 2100  dolutegravir (TIVICAY) tablet 50 mg     50 mg Oral Daily 02/12/17 2011     02/12/17 2100  emtricitabine-tenofovir AF (DESCOVY) 200-25 MG per tablet 1 tablet     1 tablet Oral Daily 02/12/17 2011     02/12/17 1800  penicillin G potassium 4 Million Units in dextrose 5 % 250 mL IVPB     4 Million Units 250 mL/hr over 60 Minutes Intravenous Every 4 hours 02/12/17 1710        Medications: Scheduled Meds: . dolutegravir  50 mg Oral Daily  . emtricitabine-tenofovir AF  1 tablet Oral Daily  . enoxaparin (LOVENOX) injection  40 mg Subcutaneous Q24H  . pencillin G potassium IV  4 Million Units Intravenous Q4H  . sodium chloride flush  3 mL Intravenous Q12H   Continuous Infusions: . sodium chloride 75 mL/hr at 02/13/17 1542   PRN Meds:.acetaminophen **OR** acetaminophen, ondansetron (ZOFRAN) IV, sodium chloride flush    Objective: Weight change:   Intake/Output Summary (Last 24 hours) at 02/13/17 1922 Last data filed at 02/13/17 1429  Gross per 24 hour  Intake              250 ml  Output             2875 ml  Net            -2625 ml   Blood pressure 124/78, pulse 93, temperature 98.3 F (36.8 C), temperature source Oral, resp. rate 13, height 6' (1.829 m), weight 189 lb 6 oz (85.9 kg), SpO2 97 %. Temp:  [98.1 F (36.7 C)-98.8 F (37.1 C)] 98.3 F (36.8 C) (04/13 1548) Pulse Rate:  [93-118] 93 (04/13 1548) Resp:  [12-16] 13 (04/13 1548) BP: (119-130)/(65-82) 124/78 (04/13 1548) SpO2:  [96 %-98 %] 97 % (04/13 1548) Weight:  [189 lb 6 oz (85.9 kg)] 189 lb 6 oz (85.9 kg) (04/12 2025)  Physical Exam: General: Alert and awake, oriented x3, not in any acute distress. HEENT: anicteric sclera, ,  EOMI CVS regular rate, normal r,   Chest:no wheezing, or respiratory distress Abdomen: soft , nondistended, normal bowel sounds, Extremities: no  clubbing or edema noted bilaterally Skin: rash from yesterday not examined today Neuro: nonfocal  CBC:    BMET  Recent Labs  02/12/17 1523 02/12/17 1916  NA 134* 130*  K 3.8 3.4*  CL 98 96*  CO2 23 25  GLUCOSE 104* 122*  BUN 12 9  CREATININE 0.96 0.90  CALCIUM 8.9 8.4*     Liver Panel   Recent Labs  02/12/17 1523 02/12/17 1916  PROT 7.4 6.9  ALBUMIN 4.4 4.0  AST 14 18  ALT 10 13*  ALKPHOS 67 59  BILITOT 0.5 0.5       Sedimentation Rate No results for input(s): ESRSEDRATE in the last 72 hours. C-Reactive Protein No results for input(s): CRP in the last 72 hours.  Micro Results: Recent Results (from the past 720 hour(s))  Culture, blood (routine x 2)     Status: None (Preliminary result)   Collection Time: 02/12/17  7:07 PM  Result Value Ref Range Status   Specimen Description  BLOOD RIGHT ANTECUBITAL  Final   Special Requests   Final    BOTTLES DRAWN AEROBIC AND ANAEROBIC Blood Culture adequate volume   Culture NO GROWTH < 24 HOURS  Final   Report Status PENDING  Incomplete  Culture, blood (routine x 2)     Status: None (Preliminary result)   Collection Time: 02/12/17  7:17 PM  Result Value Ref Range Status   Specimen Description BLOOD LEFT HAND  Final   Special Requests   Final    BOTTLES DRAWN AEROBIC AND ANAEROBIC Blood Culture adequate volume   Culture NO GROWTH < 24 HOURS  Final   Report Status PENDING  Incomplete    Studies/Results: Portable Chest 1 View  Result Date: 02/12/2017 CLINICAL DATA:  36 y/o  M; fever.  PICC placement. EXAM: PORTABLE CHEST 1 VIEW COMPARISON:  None. FINDINGS: Right PICC line tip projects over the lower SVC. Normal cardiac silhouette given projection and technique. Clear lungs. No pleural effusion or pneumothorax. No acute osseous abnormality is evident. IMPRESSION: No  active disease.  PICC line tip projects over lower SVC. Electronically Signed   By: Kristine Garbe M.D.   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 male with  with syphilis causing uveitis and newly diagnosed HIV disease admitted by me from clinic yesterday for placement of PICC line and initiation of IV penicillin.  #1 Syphilis causing uveitis: He needs 24 million units of penicillin per day this can be set up as a continuous infusion with [facility].  #2 HIV disease continue TIVICAY and DESCOVY. One month's worth of medications have been delivered to the patient's bedside.  We will follow-up all his labs in our clinic.  IV antibiotic as follows  Diagnosis: Syphilitic  uveitis  Culture Result: RPR of 1:128  No Known Allergies  Discharge antibiotics: IV pencillin via continuous infusion 24 million units daily x 2 weeks   Duration: 2 weeks   End Date:  02/26/17    [facility] Care Per Protocol:  Labs weekly while on IV antibiotics: _x_ CBC with differential x_ BMP   x__ Please pull PIC at completion of IV antibiotics __ Please leave PIC in place until doctor has seen patient or been notified  Fax weekly labs to [phone]  Clinic Follow Up Appt:  Next 2 weeks with myself vs ID pharmacy to review his HIV labs and ensure that he is tolerating his ARV regimen very well and to consider staying on current regimen vs change to [place] if HLA B701 negative.       LOS: 1 day   Alcide Evener 02/13/2017, 7:22 PM

## 2017-02-13 NOTE — Progress Notes (Signed)
[facility]  Notified by pt around 445 PM that MD will keep one more day.  [facility] has IV ABX for home ready and are prepared to support DC home on Saturday once DC confirmed.  If patient discharges after hours, please call [phone].   Sedalia Muta 02/13/2017, 5:53 PM

## 2017-02-13 NOTE — Progress Notes (Signed)
Patient counseled on Tivicay and Descovy over the phone. Family doesn't know about diagnosis. Nurse has meds to be started at discharge.   Alfredo Bach, Cleotis Nipper, PharmD Clinical Pharmacy Resident [phone] (Pager) 02/13/2017 3:41 PM

## 2017-02-14 DIAGNOSIS — H209 Unspecified iridocyclitis: Secondary | ICD-10-CM

## 2017-02-14 LAB — BASIC METABOLIC PANEL
Anion gap: 8 (ref 5–15)
BUN: 5 mg/dL — AB (ref 6–20)
CO2: 27 mmol/L (ref 22–32)
Calcium: 8.3 mg/dL — ABNORMAL LOW (ref 8.9–10.3)
Chloride: 99 mmol/L — ABNORMAL LOW (ref 101–111)
Creatinine, Ser: 0.76 mg/dL (ref 0.61–1.24)
GFR calc non Af Amer: >60 mL/min (ref >60)
Glucose, Bld: 132 mg/dL — ABNORMAL HIGH (ref 65–99)
Potassium: 3.2 mmol/L — ABNORMAL LOW (ref 3.5–5.1)
SODIUM: 134 mmol/L — AB (ref 135–145)

## 2017-02-14 LAB — CBC WITH DIFFERENTIAL/PLATELET
BASOS ABS: 0 10*3/uL (ref 0.0–0.1)
BASOS PCT: 0 %
EOS ABS: 0.3 10*3/uL (ref 0.0–0.7)
Eosinophils Relative: 4 %
HCT: 37.4 % — ABNORMAL LOW (ref 39.0–52.0)
HEMOGLOBIN: 12.7 g/dL — AB (ref 13.0–17.0)
LYMPHS ABS: 4 10*3/uL (ref 0.7–4.0)
LYMPHS PCT: 54 %
MCH: 27.7 pg (ref 26.0–34.0)
MCHC: 34 g/dL (ref 30.0–36.0)
MCV: 81.5 fL (ref 78.0–100.0)
MONO ABS: 0.7 10*3/uL (ref 0.1–1.0)
Monocytes Relative: 9 %
NEUTROS ABS: 2.4 10*3/uL (ref 1.7–7.7)
Neutrophils Relative %: 33 %
PLATELETS: 178 10*3/uL (ref 150–400)
RBC: 4.59 MIL/uL (ref 4.22–5.81)
RDW: 12.7 % (ref 11.5–15.5)
WBC: 7.4 10*3/uL (ref 4.0–10.5)

## 2017-02-14 MED ORDER — HEPARIN SOD (PORK) LOCK FLUSH 100 UNIT/ML IV SOLN
250.0000 [IU] | INTRAVENOUS | Status: AC | PRN
  Administered 2017-02-14: 250 [IU]

## 2017-02-14 NOTE — Discharge Summary (Signed)
Physician Discharge Summary  Dalton Rodriguez KKX:[id number] DOB: June 07, 1981 DOA: 02/12/2017  PCP: [place] date: 02/12/2017 Discharge date: 02/14/2017  Admitted From: Home Disposition:  Home   Recommendations for Outpatient Follow-up:  1. Follow up with Infectious Disease Clinic in 2 weeks 2. Weekly while on IV antibiotics: CBC with differential and BMP 3. To pull PIC at completion of IV antibiotics.  4. Pending result: HIV 1 RNA PCR, HLA B 5701   Home Health: Home infusion for antibiotics.  Equipment/Devices: NA  Discharge Condition: Stable  CODE STATUS: Full  Diet recommendation: Regular.   Brief/Interim Summary: This is a 36 year old male who was admitted to the hospital from the infectious disease clinic to start IV penicillin for a new diagnosis of syphilis causing uveitis. Patient complain of claudication for last 4 months, rash in the upper back for last 2 weeks no fevers or chills. His RPR was positive 1:128. Patient tested positive for HIV. On initial physical examination blood pressure 134/93, heart rate 113, respiratory rate 17, temperature 100.7, oxygen saturation 100%. Positive conjunctival erythema, rash on the upper back, round plaques. Sodium 130, potassium 3.4, chloride 96, bicarbonate 25, glucose 122, BUN 9, creatinine 0.90, white count 11.5, hemoglobin 13.5, hematocrit 39.9, platelets 204. Urine analysis negative for infection.  The patient was admitted to the hospital working diagnosis of syphilis causing uveitis and you diagnosis of HIV/ AIDS.  1. Syphilis causing conjunctivitis. Patient was admitted to the medical floor, IV penicillin was started with good toleration. Patient had a PICC line placed with the plan to continue antibiotics for 2 weeks. 24 million units of penicillin per day as a continuous infusion with [facility]. Follow up with the ID clinic in 2 weeks with end date 02/26/2017. Will need weekly CBC with differential and BMP.    2. Newly diagnosed HIV- AIDS. Patient was started on antiretroviral therapy with TIVICAY and DESCOVY. HIV 1 RNA PCR and HLA B 5701 pending. Chlamydia and Neisseria were negative, hep C antibody negative, hep B surface antibody positive, hep B surface antigen negative. CD4 T Cell, absolute 560 with CD4 % Helper T cell 11. Vaccination at the infection to this clinic.    Discharge Diagnoses:  Principal Problem:   Syphilitic uveitis Active Problems:   HIV disease [facility])   Uveitis    Discharge Instructions  Discharge Instructions    Home infusion instructions [facility] May follow [place] Dosing Protocol; May administer Cathflo as needed to maintain patency of vascular access device.; Flushing of vascular access device: per [facility] Protocol: 0.9% NaCl pre/post medica...    Complete by:  As directed    Instructions:  May follow [place] Dosing Protocol   Instructions:  May administer Cathflo as needed to maintain patency of vascular access device.   Instructions:  Flushing of vascular access device: per [facility] Protocol: 0.9% NaCl pre/post medication administration and prn patency; Heparin 100 u/ml, 50m for implanted ports and Heparin 10u/ml, 562mfor all other central venous catheters.   Instructions:  May follow [facility] Anaphylaxis Protocol for First Dose Administration in the home: 0.9% NaCl at 25-50 ml/hr to maintain IV access for protocol meds. Epinephrine 0.3 ml IV/IM PRN and Benadryl 25-50 IV/IM PRN s/s of anaphylaxis.   Instructions:  [facility] Coordinator (RN) to assist per patient IV care needs in the home PRN.     Allergies as of 02/14/2017   No Known Allergies     Medication List  STOP taking these medications   ibuprofen 200 MG tablet Commonly known as:  ADVIL,MOTRIN     TAKE these medications   dolutegravir 50 MG tablet Commonly known as:  TIVICAY Take 1 tablet (50 mg total) by mouth daily.   DUREZOL 0.05 % Emul Generic drug:  Difluprednate    emtricitabine-tenofovir AF 200-25 MG tablet Commonly known as:  DESCOVY Take 1 tablet by mouth daily.   penicillin G IVPB Inject 24 Million Units into the vein continuous. Indication:  Ocular syphilis Last Day of Therapy:  4/26 Labs - Once weekly:  CBC/D and BMP, Labs - Every other week:  ESR and CRP            Home Infusion Instuctions        Start     Ordered   02/13/17 0000  Home infusion instructions [facility] May follow [place] Dosing Protocol; May administer Cathflo as needed to maintain patency of vascular access device.; Flushing of vascular access device: per [facility] Protocol: 0.9% NaCl pre/post medica...    Question Answer Comment  Instructions May follow [place] Dosing Protocol   Instructions May administer Cathflo as needed to maintain patency of vascular access device.   Instructions Flushing of vascular access device: per [facility] Protocol: 0.9% NaCl pre/post medication administration and prn patency; Heparin 100 u/ml, 83m for implanted ports and Heparin 10u/ml, 537mfor all other central venous catheters.   Instructions May follow [facility] Anaphylaxis Protocol for First Dose Administration in the home: 0.9% NaCl at 25-50 ml/hr to maintain IV access for protocol meds. Epinephrine 0.3 ml IV/IM PRN and Benadryl 25-50 IV/IM PRN s/s of anaphylaxis.   Instructions [facility] Infusion Coordinator (RN) to assist per patient IV care needs in the home PRN.      02/13/17 1516      No Known Allergies  Consultations:  Infectious disease   Procedures/Studies: Portable Chest 1 View  Result Date: 02/12/2017 CLINICAL DATA:  [age]/o  M; fever.  PICC placement. EXAM: PORTABLE CHEST 1 VIEW COMPARISON:  None. FINDINGS: Right PICC line tip projects over the lower SVC. Normal cardiac silhouette given projection and technique. Clear lungs. No pleural effusion or pneumothorax. No acute osseous abnormality is evident. IMPRESSION: No active disease.  PICC line tip projects  over lower SVC. Electronically Signed   By: LaKristine Garbe.D.   On: 02/12/2017 22:27       Subjective: Patient feeling better, no nausea or vomiting, no blurry vision.   Discharge Exam: Vitals:   02/14/17 0518 02/14/17 0903  BP: 123/76 126/81  Pulse: 77 83  Resp: 16 18  Temp: 98 F (36.7 C) 98.2 F (36.8 C)   Vitals:   02/13/17 2117 02/13/17 2304 02/14/17 0518 02/14/17 0903  BP: 136/86  123/76 126/81  Pulse: 89  77 83  Resp: _0 Temp: 98 F (36.7 C)  98 F (36.7 C) 98.2 F (36.8 C)  TempSrc: Oral  Oral Oral  SpO2: 97%  98% 98%  Weight:  86.2 kg (190 lb 1.6 oz)    Height:        General: Pt is alert, awake, not in acute distress. Positive conjunctival erythema.  Cardiovascular: RRR, S1/S2 +, no rubs, no gallops Respiratory: CTA bilaterally, no wheezing, no rhonchi Abdominal: Soft, NT, ND, bowel sounds + Extremities: no edema, no cyanosis Skin. Positive rash on the upper back, round plaques.     The results of significant diagnostics from this hospitalization (  including imaging, microbiology, ancillary and laboratory) are listed below for reference.     Microbiology: Recent Results (from the past 240 hour(s))  Culture, blood (routine x 2)     Status: None (Preliminary result)   Collection Time: 02/12/17  7:07 PM  Result Value Ref Range Status   Specimen Description BLOOD RIGHT ANTECUBITAL  Final   Special Requests   Final    BOTTLES DRAWN AEROBIC AND ANAEROBIC Blood Culture adequate volume   Culture NO GROWTH < 24 HOURS  Final   Report Status PENDING  Incomplete  Culture, blood (routine x 2)     Status: None (Preliminary result)   Collection Time: 02/12/17  7:17 PM  Result Value Ref Range Status   Specimen Description BLOOD LEFT HAND  Final   Special Requests   Final    BOTTLES DRAWN AEROBIC AND ANAEROBIC Blood Culture adequate volume   Culture NO GROWTH < 24 HOURS  Final   Report Status PENDING  Incomplete     Labs: BNP (last 3  results) No results for input(s): BNP in the last 8760 hours. Basic Metabolic Panel:  Recent Labs Lab 02/12/17 1523 02/12/17 1906 02/12/17 1916 02/14/17 0446  NA 134*  --  130* 134*  K 3.8  --  3.4* 3.2*  CL 98  --  96* 99*  CO2 23  --  25 27  GLUCOSE 104*  --  122* 132*  BUN 12  --  9 5*  CREATININE 0.96  --  0.90 0.76  CALCIUM 8.9  --  8.4* 8.3*  MG  --  1.8  --   --    Liver Function Tests:  Recent Labs Lab 02/12/17 1523 02/12/17 1916  AST 14 18  ALT 10 13*  ALKPHOS 67 59  BILITOT 0.5 0.5  PROT 7.4 6.9  ALBUMIN 4.4 4.0   No results for input(s): LIPASE, AMYLASE in the last 168 hours. No results for input(s): AMMONIA in the last 168 hours. CBC:  Recent Labs Lab 02/12/17 1523 02/12/17 1916 02/13/17 0436 02/14/17 0446  WBC 11.1* 11.5* 11.3* 7.4  NEUTROABS 5,217  --   --  2.4  HGB 14.3 13.5 12.7* 12.7*  HCT 43.3 39.9 38.1* 37.4*  MCV 81.5 82.1 82.1 81.5  PLT 227 204 210 178   Cardiac Enzymes: No results for input(s): CKTOTAL, CKMB, CKMBINDEX, TROPONINI in the last 168 hours. BNP: Invalid input(s): POCBNP CBG: No results for input(s): GLUCAP in the last 168 hours. D-Dimer No results for input(s): DDIMER in the last 72 hours. Hgb A1c No results for input(s): HGBA1C in the last 72 hours. Lipid Profile No results for input(s): CHOL, HDL, LDLCALC, TRIG, CHOLHDL, LDLDIRECT in the last 72 hours. Thyroid function studies  Recent Labs  02/12/17 1907  TSH 0.226*   Anemia work up No results for input(s): VITAMINB12, FOLATE, FERRITIN, TIBC, IRON, RETICCTPCT in the last 72 hours. Urinalysis    Component Value Date/Time   COLORURINE YELLOW 02/12/2017 2046   APPEARANCEUR CLEAR 02/12/2017 2046   LABSPEC 1.006 02/12/2017 2046   PHURINE 6.0 02/12/2017 2046   GLUCOSEU NEGATIVE 02/12/2017 2046   HGBUR NEGATIVE 02/12/2017 2046   BILIRUBINUR NEGATIVE 02/12/2017 2046   KETONESUR NEGATIVE 02/12/2017 2046   PROTEINUR NEGATIVE 02/12/2017 2046   NITRITE NEGATIVE  02/12/2017 2046   LEUKOCYTESUR NEGATIVE 02/12/2017 2046   Sepsis Labs Invalid input(s): PROCALCITONIN,  WBC,  LACTICIDVEN Microbiology Recent Results (from the past 240 hour(s))  Culture, blood (routine x 2)  Status: None (Preliminary result)   Collection Time: 02/12/17  7:07 PM  Result Value Ref Range Status   Specimen Description BLOOD RIGHT ANTECUBITAL  Final   Special Requests   Final    BOTTLES DRAWN AEROBIC AND ANAEROBIC Blood Culture adequate volume   Culture NO GROWTH < 24 HOURS  Final   Report Status PENDING  Incomplete  Culture, blood (routine x 2)     Status: None (Preliminary result)   Collection Time: 02/12/17  7:17 PM  Result Value Ref Range Status   Specimen Description BLOOD LEFT HAND  Final   Special Requests   Final    BOTTLES DRAWN AEROBIC AND ANAEROBIC Blood Culture adequate volume   Culture NO GROWTH < 24 HOURS  Final   Report Status PENDING  Incomplete     Time coordinating discharge: 45 minutes  SIGNED:   Tawni Millers, MD  [facility] 02/14/2017, 9:18 AM Pager   If 7PM-7AM, please contact night-coverage [email or web address] Password [email or web address]

## 2017-02-16 ENCOUNTER — Other Ambulatory Visit: Payer: Self-pay | Admitting: Pharmacist

## 2017-02-16 ENCOUNTER — Encounter: Payer: Self-pay | Admitting: Infectious Disease

## 2017-02-16 DIAGNOSIS — B20 Human immunodeficiency virus [HIV] disease: Secondary | ICD-10-CM

## 2017-02-16 LAB — CYTOLOGY, (ORAL, ANAL, URETHRAL) ANCILLARY ONLY
CHLAMYDIA, DNA PROBE: NEGATIVE
CHLAMYDIA, DNA PROBE: NEGATIVE
NEISSERIA GONORRHEA: NEGATIVE
Neisseria Gonorrhea: NEGATIVE

## 2017-02-16 LAB — HIV RNA, RTPCR W/R GT (RTI, PI,INT)
HIV-1 RNA, QN PCR: 402000 copies/mL — ABNORMAL HIGH
HIV-1 RNA, QN PCR: 5.6 Log copies/mL — ABNORMAL HIGH

## 2017-02-16 MED ORDER — DOLUTEGRAVIR SODIUM 50 MG PO TABS: 50.0000 mg | ORAL_TABLET | Freq: Every day | ORAL | 5 refills | Status: DC

## 2017-02-16 MED ORDER — EMTRICITABINE-TENOFOVIR AF 200-25 MG PO TABS: 1.0000 | ORAL_TABLET | Freq: Every day | ORAL | 5 refills | Status: DC

## 2017-02-16 MED FILL — TIVICAY 50 MG TABLET: 50 | 30 days supply | Qty: 30 | Fill #0

## 2017-02-16 NOTE — Progress Notes (Signed)
His insurance requires him to use [employer] [facility].  Kathie Rhodes and I were able to get a one time fill at [facility], but he will require specialty pharmacy from now on. Sent his meds to the correct pharmacy.

## 2017-02-17 ENCOUNTER — Ambulatory Visit: Payer: Self-pay | Admitting: Internal Medicine

## 2017-02-17 LAB — CULTURE, BLOOD (ROUTINE X 2)
CULTURE: NO GROWTH
Culture: NO GROWTH
SPECIAL REQUESTS: ADEQUATE
SPECIAL REQUESTS: ADEQUATE

## 2017-02-19 ENCOUNTER — Telehealth: Payer: Self-pay | Admitting: *Deleted

## 2017-02-19 LAB — RFLX HIV-1 INTEGRASE GENOTYPE

## 2017-02-19 LAB — HIV-1 GENOTYPE: HIV-1 GENOTYPE: DETECTED — AB

## 2017-02-19 LAB — HLA B*5701: HLA-B 5701 W/RFLX HLA-B HIGH: NEGATIVE

## 2017-02-19 NOTE — Telephone Encounter (Signed)
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 She is calling to make sure that is ok and get an order to pull PICC on 02/24/17 in the evening. Advised will ask the doctor and give her a call back.

## 2017-02-20 MED ORDER — DOXYCYCLINE HYCLATE 100 MG PO TABS: 100.0000 mg | ORAL_TABLET | Freq: Two times a day (BID) | ORAL | 0 refills | Status: AC

## 2017-02-20 NOTE — Telephone Encounter (Signed)
I would prefer if the patient can complete a full two weeks of high-dose IV penicillin. Can it not They taken with him on his trip so I can complete a full 14 days? If not we can cut it short by a day and have him doxy 100 mg bid x 1 d

## 2017-02-20 NOTE — Addendum Note (Signed)
Addended by: Lurlean Leyden on: 02/20/2017 11:58 AM   Modules accepted: Orders

## 2017-02-20 NOTE — Telephone Encounter (Signed)
Spoke with Dalton Rodriguez and she advised the patient does not want the PICC in while on his trip but she does think he will be compliant with taking the oral medication in place of the PICC. Advised ok to D/C the PICC 02/24/17 and will call in the oral medication to local pharmacy for him to pick up.

## 2017-03-02 ENCOUNTER — Ambulatory Visit: Payer: BLUE CROSS/BLUE SHIELD

## 2017-03-05 ENCOUNTER — Encounter: Payer: Self-pay | Admitting: *Deleted

## 2017-03-16 ENCOUNTER — Ambulatory Visit (INDEPENDENT_AMBULATORY_CARE_PROVIDER_SITE_OTHER): Payer: BLUE CROSS/BLUE SHIELD | Admitting: Pharmacist

## 2017-03-16 DIAGNOSIS — B20 Human immunodeficiency virus [HIV] disease: Secondary | ICD-10-CM | POA: Diagnosis not present

## 2017-03-16 MED ORDER — DOLUTEGRAVIR SODIUM 50 MG PO TABS: 50.0000 mg | ORAL_TABLET | Freq: Every day | ORAL | 1 refills | Status: DC

## 2017-03-16 MED ORDER — EMTRICITABINE-TENOFOVIR AF 200-25 MG PO TABS: 1.0000 | ORAL_TABLET | Freq: Every day | ORAL | 1 refills | Status: DC

## 2017-03-16 NOTE — Progress Notes (Signed)
HPI: Dalton Rodriguez is a 36 y.o. male who presents to the [facility] pharmacy clinic for HIV follow-up.   Allergies: No Known Allergies  Past Medical History: Past Medical History:  Diagnosis Date  . HIV disease (HCC) 02/12/2017  . 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 Marital status: Married    Spouse name: N/A  . Number of children: N/A  . Years of education: N/A   Social History Main Topics  . Smoking status: Never Smoker  . Smokeless tobacco: Never Used  . Alcohol use Yes     Comment: 3-4 drinks a week  . Drug use: No  . Sexual activity: Yes    Partners: Male    Birth control/ protection: Condom   Other Topics Concern  . Not on file   Social History Narrative  . No narrative on file    Current Regimen: Tivicay + Descovy  Labs: CD4 T Cell Abs (/uL)  Date Value  02/13/2017 560   Hep B S Ab (no units)  Date Value  02/12/2017 POS (A)   Hepatitis B Surface Ag (no units)  Date Value  02/12/2017 NEGATIVE   HCV Ab (no units)  Date Value  02/12/2017 NEGATIVE    CrCl: CrCl cannot be calculated (Patient's most recent lab result is older than the maximum 21 days allowed.).  Lipids: No results found for: CHOL, TRIG, HDL, CHOLHDL, VLDL, LDLCALC  Assessment: Dalton Rodriguez is here today for HIV follow-up. He is a newly diagnosed patient currently on Tivicay and Descovy.  He is tolerating the Tivicay and Descovy well since starting ~1 month ago.  He is not having any side effects and tells me he has not missed any doses. He takes it every morning around 9am.  He took his last pills today and was wondering about refills.  He says [facility] has been calling him but he thought it was a Designer, multimediasolicitor trying to get his business.  I told him that his [employer] insurance requires him to fill there.  He will call today and get them to overnight it to him. Told him to make sure he calls about 7 days before he runs out next month.  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  He will not be moving until after Labor Day.  I told him that when he sees Dr. Daiva EvesVan Dam in July, to tell him and they will start getting the papers together for him to sign to share medical information.  I told him that once he knows where he is going to live to google HIV/ID clinics in that area and we will send his records.  He states his insurance might run out in before he moves, so I sent him in a 90 day supply.  I told him to call me ASAP if they won't give him 90 days. I made an appt with Dr. Daiva EvesVan Dam for him.  Will get labs today.   Plans: - Continue Tivicay and Descovy - HIV VL and CD4 today - F/u with Dr. Daiva EvesVan Dam 7/9 at 4pm  Arleene Settle L. Rigel Filsinger, PharmD, CPP Infectious Diseases Clinical Pharmacist [facility] 03/16/2017, 4:13 PM

## 2017-03-16 NOTE — Addendum Note (Signed)
Addended by: Mariea ClontsGREEN, Karina Lenderman D on: 03/16/2017 04:51 PM   Modules accepted: Orders

## 2017-03-18 LAB — T-HELPER CELL (CD4) - (RCID CLINIC ONLY)
CD4 % Helper T Cell: 18 % — ABNORMAL LOW (ref 33–55)
CD4 T Cell Abs: 1180 /uL (ref 400–2700)

## 2017-03-18 LAB — HIV-1 RNA QUANT-NO REFLEX-BLD
HIV 1 RNA Quant: 94 copies/mL — ABNORMAL HIGH
HIV-1 RNA QUANT, LOG: 1.97 {Log_copies}/mL — AB

## 2017-05-11 ENCOUNTER — Ambulatory Visit: Payer: BLUE CROSS/BLUE SHIELD | Admitting: Infectious Disease

## 2018-06-05 IMAGING — CR DG CHEST 1V PORT
1 series · 1 of 1 positions shown · non-contrast
Comparison: None.

CLINICAL DATA: 36 y/o  M; fever.  PICC placement.

EXAM:
PORTABLE CHEST 1 VIEW

[AP]
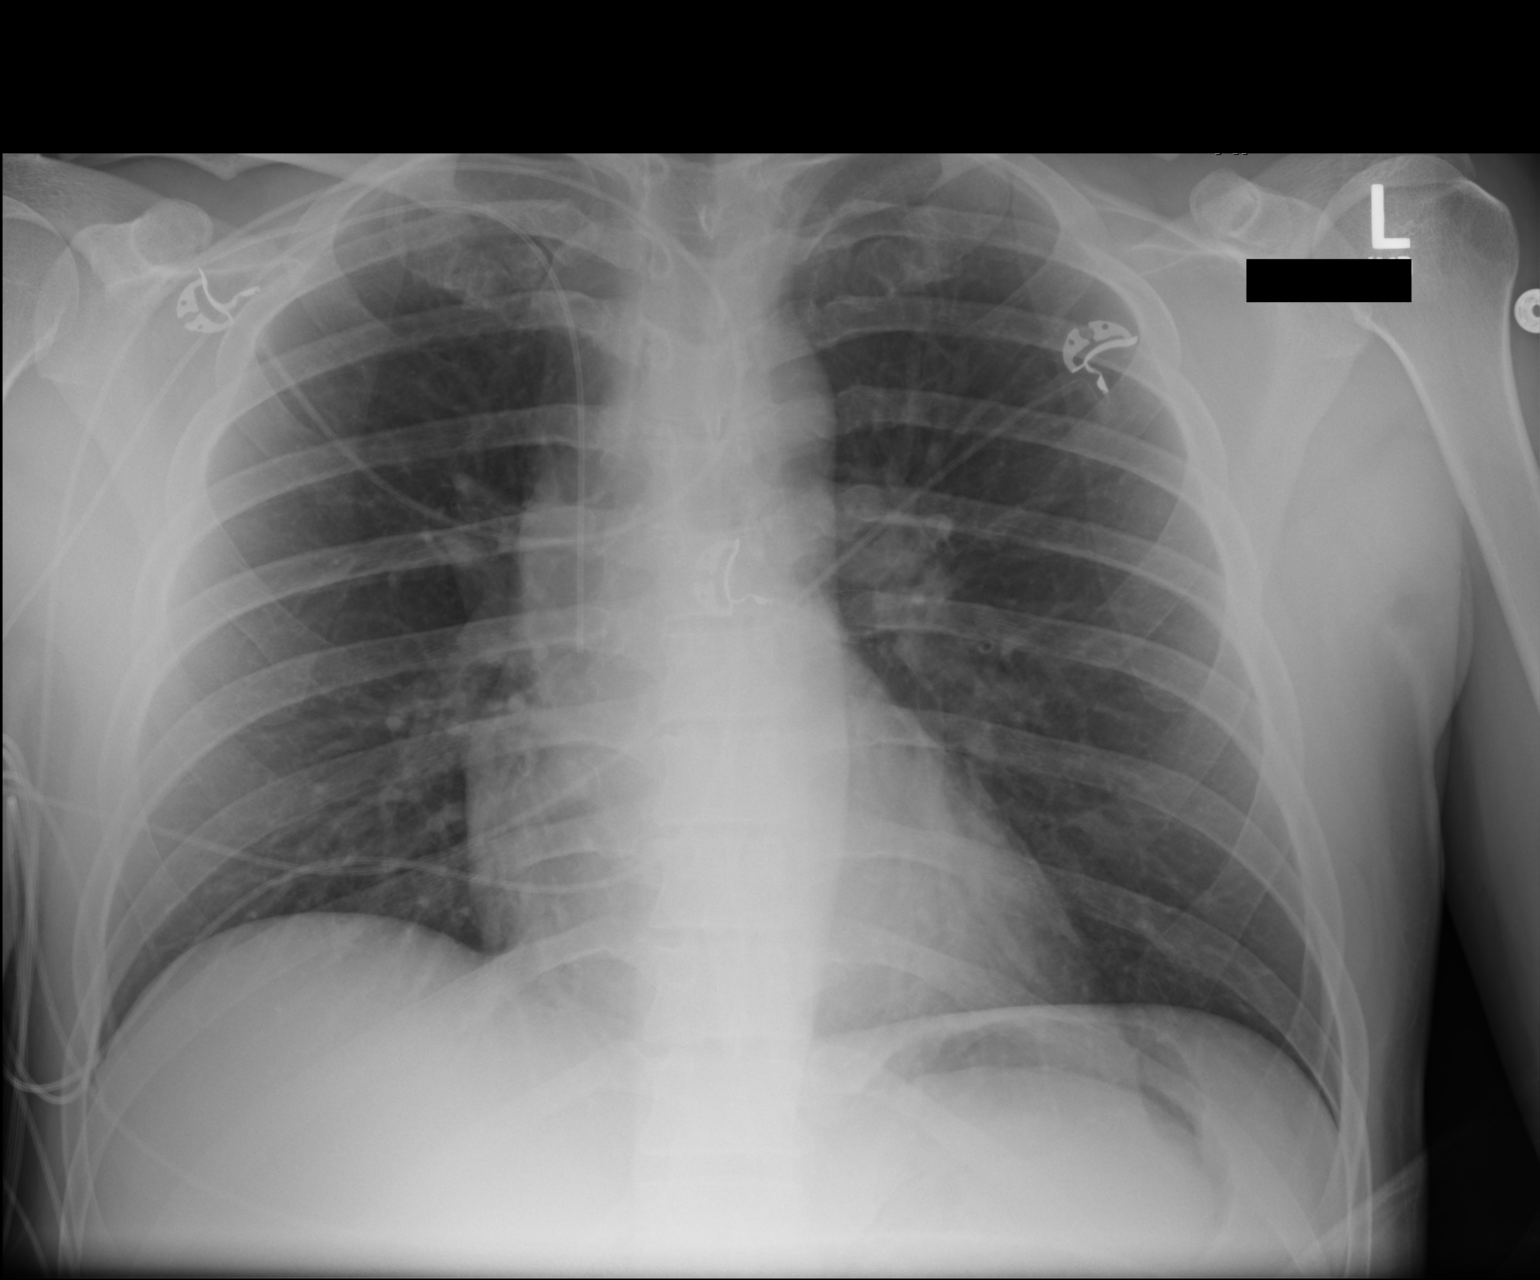

[1 of 1 positions shown; findings below may reference images not displayed]

FINDINGS: Right PICC line tip projects over the lower SVC. Normal cardiac
silhouette given projection and technique. Clear lungs. No pleural
effusion or pneumothorax. No acute osseous abnormality is evident.
IMPRESSION: No active disease.  PICC line tip projects over lower SVC.

By: Ino Pond M.D.

## 2018-10-06 ENCOUNTER — Encounter (HOSPITAL_COMMUNITY): Payer: Self-pay

## 2018-10-06 ENCOUNTER — Other Ambulatory Visit: Payer: Self-pay

## 2018-10-06 ENCOUNTER — Emergency Department (HOSPITAL_COMMUNITY)
Admission: EM | Admit: 2018-10-06 | Discharge: 2018-10-07 | Disposition: A | Discharge status: Home / Self Care | Payer: BLUE CROSS/BLUE SHIELD | Attending: Emergency Medicine | Admitting: Emergency Medicine

## 2018-10-06 DIAGNOSIS — T50901A Poisoning by unspecified drugs, medicaments and biological substances, accidental (unintentional), initial encounter: Secondary | ICD-10-CM | POA: Insufficient documentation

## 2018-10-06 DIAGNOSIS — Z9119 Patient's noncompliance with other medical treatment and regimen: Secondary | ICD-10-CM | POA: Insufficient documentation

## 2018-10-06 DIAGNOSIS — Z79899 Other long term (current) drug therapy: Secondary | ICD-10-CM | POA: Insufficient documentation

## 2018-10-06 DIAGNOSIS — Z21 Asymptomatic human immunodeficiency virus [HIV] infection status: Secondary | ICD-10-CM | POA: Insufficient documentation

## 2018-10-06 LAB — CBC WITH DIFFERENTIAL/PLATELET
Abs Immature Granulocytes: 0.06 10*3/uL (ref 0.00–0.07)
Basophils Absolute: 0.1 10*3/uL (ref 0.0–0.1)
Basophils Relative: 1 %
EOS PCT: 6 %
Eosinophils Absolute: 0.7 10*3/uL — ABNORMAL HIGH (ref 0.0–0.5)
HCT: 41.3 % (ref 39.0–52.0)
Hemoglobin: 12.9 g/dL — ABNORMAL LOW (ref 13.0–17.0)
Immature Granulocytes: 1 %
Lymphocytes Relative: 30 %
Lymphs Abs: 3.2 10*3/uL (ref 0.7–4.0)
MCH: 26.9 pg (ref 26.0–34.0)
MCHC: 31.2 g/dL (ref 30.0–36.0)
MCV: 86 fL (ref 80.0–100.0)
MONO ABS: 0.7 10*3/uL (ref 0.1–1.0)
Monocytes Relative: 7 %
NEUTROS ABS: 6 10*3/uL (ref 1.7–7.7)
Neutrophils Relative %: 55 %
PLATELETS: 299 10*3/uL (ref 150–400)
RBC: 4.8 MIL/uL (ref 4.22–5.81)
RDW: 13.6 % (ref 11.5–15.5)
WBC: 10.8 10*3/uL — AB (ref 4.0–10.5)
nRBC: 0 % (ref 0.0–0.2)

## 2018-10-06 LAB — COMPREHENSIVE METABOLIC PANEL
ALT: 128 U/L — ABNORMAL HIGH (ref 0–44)
AST: 72 U/L — AB (ref 15–41)
Albumin: 4.6 g/dL (ref 3.5–5.0)
Alkaline Phosphatase: 98 U/L (ref 38–126)
Anion gap: 10 (ref 5–15)
BILIRUBIN TOTAL: 0.3 mg/dL (ref 0.3–1.2)
BUN: 26 mg/dL — AB (ref 6–20)
CO2: 26 mmol/L (ref 22–32)
Calcium: 9.2 mg/dL (ref 8.9–10.3)
Chloride: 103 mmol/L (ref 98–111)
Creatinine, Ser: 0.97 mg/dL (ref 0.61–1.24)
GFR calc Af Amer: >60 mL/min (ref >60)
Glucose, Bld: 132 mg/dL — ABNORMAL HIGH (ref 70–99)
POTASSIUM: 4.2 mmol/L (ref 3.5–5.1)
Sodium: 139 mmol/L (ref 135–145)
Total Protein: 8.6 g/dL — ABNORMAL HIGH (ref 6.5–8.1)

## 2018-10-06 LAB — ETHANOL: Alcohol, Ethyl (B): <10 mg/dL (ref <10)

## 2018-10-06 LAB — ACETAMINOPHEN LEVEL

## 2018-10-06 MED ORDER — SODIUM CHLORIDE 0.9 % IV SOLN: 1000.0000 mL | INTRAVENOUS | Status: DC

## 2018-10-06 MED ORDER — SODIUM CHLORIDE 0.9 % IV BOLUS (SEPSIS)
1000.0000 mL | Freq: Once | INTRAVENOUS | Status: AC
  Administered 2018-10-06: 1000 mL via INTRAVENOUS

## 2018-10-06 NOTE — ED Notes (Signed)
Pt made aware that urine sample is needed. Urinal at bedside  

## 2018-10-06 NOTE — ED Triage Notes (Signed)
Per EMS, patient found in [employer] [place] with friends. He has a history of IV drug use, but does not know what he was given tonight. He states that he "feels different than normal and just does not feel right  Rescue breathing given by fire for about 2 minutes before patient became A&O. EMS did not administer narcan.

## 2018-10-06 NOTE — ED Provider Notes (Signed)
[facility] DEPT Provider Note   CSN: [id number] Arrival date & time: 10/06/18  1924     History   Chief Complaint Chief Complaint  Patient presents with  . Drug Overdose    HPI Dalton Rodriguez is a 37 y.o. male.  HPI Patient was found in the [place] lobby bathroom with friends.  He was poorly responsive.  2 rescue breaths were given but patient was not given Narcan.  He responded and did not deteriorate.  Patient has a difficult time explaining exactly what happened.  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  He is not really sure, per his report, what drugs he took at this particular time.  He reports things got out of the hand and he just felt really different than usual.  Patient has recently moved back to this area from [place].  He has not been compliant with his HIV medications over the past month.  He states he has run out of medication and is not insured right now.  He has not had fever chills or other generalized illness.  He reports he does have family in the [place] area. Past Medical History:  Diagnosis Date  . HIV disease ([place]) 02/12/2017  . 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 HIV disease ([place]) 02/12/2017  . 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 Uveitis 02/12/2017    Past Surgical History:  Procedure Laterality Date  . WISDOM TOOTH EXTRACTION          Home Medications    Prior to Admission medications   Medication Sig Start Date End Date Taking? Authorizing Provider  dolutegravir (TIVICAY) 50 MG tablet Take 1 tablet (50 mg total) by mouth daily. 03/16/17  Yes Kuppelweiser, Cassie L, RPH-CPP  emtricitabine-tenofovir AF (DESCOVY) 200-25 MG tablet Take 1 tablet by mouth daily. 03/16/17  Yes Kuppelweiser, Cassie L, RPH-CPP  penicillin G IVPB Inject 24 Million Units into the vein continuous. Indication:  Ocular syphilis Last Day of Therapy:  4/26 Labs - Once weekly:  CBC/D  and BMP, Labs - Every other week:  ESR and CRP 02/13/17   Arrien, Jimmy Picket, MD    Family History No family history on file.  Social History Social History   Tobacco Use  . Smoking status: Never Smoker  . Smokeless tobacco: Never Used  Substance Use Topics  . Alcohol use: Yes    Comment: 3-4 drinks a week  . Drug use: No     Allergies   Patient has no known allergies.   Review of Systems Review of Systems  10 Systems reviewed and are negative for acute change except as noted in the HPI.  Physical Exam Updated Vital Signs BP 129/89 (BP Location: Left Arm)   Pulse 84   Temp (!) 97.3 F (36.3 C) (Oral)   Resp 13   SpO2 96%   Physical Exam  Constitutional: He is oriented to person, place, and time.  Patient is resting but no respiratory distress.  When awakened he is mildly agitated but answering questions appropriately.  He reports he just feels really weird.  Color is good.  HENT:  Head: Normocephalic and atraumatic.  Mouth/Throat: Oropharynx is clear and moist.  Eyes: Pupils are equal, round, and reactive to light. EOM are normal.  Neck: Neck supple.  Cardiovascular: Normal rate, regular rhythm, normal heart sounds and intact distal pulses.  Pulmonary/Chest: [place] normal and breath sounds normal.  Abdominal:  Soft. He exhibits no distension. There is no tenderness. There is no guarding.  Musculoskeletal: Normal range of motion. He exhibits no edema or tenderness.  Neurological: He is alert and oriented to person, place, and time. No cranial nerve deficit. He exhibits normal muscle tone. Coordination normal.  Skin: Skin is warm and dry.  Track marks bilateral forearms.  No abscess or erythema.     ED Treatments / Results  Labs (all labs ordered are listed, but only abnormal results are displayed) Labs Reviewed  COMPREHENSIVE METABOLIC PANEL - Abnormal; Notable for the following components:      Result Value   Glucose, Bld 132 (*)    BUN 26 (*)     Total Protein 8.6 (*)    AST 72 (*)    ALT 128 (*)    All other components within normal limits  CBC WITH DIFFERENTIAL/PLATELET - Abnormal; Notable for the following components:   WBC 10.8 (*)    Hemoglobin 12.9 (*)    Eosinophils Absolute 0.7 (*)    All other components within normal limits  ACETAMINOPHEN LEVEL - Abnormal; Notable for the following components:   Acetaminophen (Tylenol), Serum <10 (*)    All other components within normal limits  ETHANOL  URINALYSIS, ROUTINE W REFLEX MICROSCOPIC  RAPID URINE DRUG SCREEN, HOSP PERFORMED    EKG None  Radiology No results found.  Procedures Procedures (including critical care time)  Medications Ordered in ED Medications  sodium chloride 0.9 % bolus 1,000 mL (0 mLs Intravenous Stopped 10/06/18 2241)    Followed by  0.9 %  sodium chloride infusion (has no administration in time range)     Initial Impression / Assessment and Plan / ED Course  I have reviewed the triage vital signs and the nursing notes.  Pertinent labs & imaging results that were available during my care of the patient were reviewed by me and considered in my medical decision making (see chart for details).     Patient kept in observation over 4 hours.  He has not required any Narcan.  He awakens to light stimulus.  He is oriented.  Patient reports use of methamphetamine but denies opioid abuse.  He reports he is very ashamed of this.  He is been asked multiple times in different ways whether or not he has suicidal thoughts or intention.  Patient reports he does not.  Patient has been noncompliant with his HIV treatment.  He is not showing any signs at this time of intercurrent infection.  He is alert and appropriate.  Patient is counseled on calling infectious disease tomorrow to get reestablished and start treatment again.  He is also counseled on seeking treatment for drug abuse.  Patient is discharged in stable condition.  He reports he has a brother in the area  with whom he can stay.  Final Clinical Impressions(s) / ED Diagnoses   Final diagnoses:  Accidental drug overdose, initial encounter  Asymptomatic HIV infection [facility])    ED Discharge Orders    None       Charlesetta Shanks, MD 10/07/18 [phone]

## 2018-10-06 NOTE — ED Notes (Signed)
Bed: EA54WA18 Expected date:  Expected time:  Means of arrival:  Comments[facility]

## 2018-10-07 NOTE — Discharge Instructions (Signed)
1.  Call Dr. Lattie HawVandam's office tomorrow morning.  You must restart your HIV treatment as soon as possible. 2.  Use the resource guide to find substance abuse counseling. 3.  Stay with someone you trust for the next several days.  Return immediately to the emergency department if you have worsening or changing symptoms or do not feel safe.

## 2018-10-12 ENCOUNTER — Telehealth: Payer: Self-pay | Admitting: Behavioral Health

## 2018-10-12 NOTE — Telephone Encounter (Addendum)
Attempted to call patient to schedule an appointment. Left voicemail to call the office back and left the call back number.  Printed patient information to give to Jer[place]oM[place]. Angeline SlimAshley Khani Paino RN

## 2018-10-13 NOTE — Telephone Encounter (Signed)
Attempted to call patient to schedule an appointment. Left voicemail to call the office back and left the call back number.   Angeline SlimAshley Arav Bannister RN

## 2018-12-14 ENCOUNTER — Other Ambulatory Visit: Payer: Self-pay

## 2018-12-14 ENCOUNTER — Encounter (HOSPITAL_BASED_OUTPATIENT_CLINIC_OR_DEPARTMENT_OTHER): Payer: Self-pay | Admitting: *Deleted

## 2018-12-14 DIAGNOSIS — Z5321 Procedure and treatment not carried out due to patient leaving prior to being seen by health care provider: Secondary | ICD-10-CM | POA: Insufficient documentation

## 2018-12-14 DIAGNOSIS — R05 Cough: Secondary | ICD-10-CM | POA: Insufficient documentation

## 2018-12-14 DIAGNOSIS — R509 Fever, unspecified: Secondary | ICD-10-CM | POA: Insufficient documentation

## 2018-12-14 DIAGNOSIS — M791 Myalgia, unspecified site: Secondary | ICD-10-CM | POA: Insufficient documentation

## 2018-12-14 MED ORDER — IBUPROFEN 400 MG PO TABS
400.0000 mg | ORAL_TABLET | Freq: Once | ORAL | Status: AC
  Administered 2018-12-14: 400 mg via ORAL
  Filled 2018-12-14: qty 1

## 2018-12-14 MED ORDER — ACETAMINOPHEN 325 MG PO TABS
650.0000 mg | ORAL_TABLET | Freq: Once | ORAL | Status: AC
  Administered 2018-12-14: 650 mg via ORAL
  Filled 2018-12-14: qty 2

## 2018-12-14 NOTE — ED Notes (Signed)
Unable to locate pt when called to repeat VS.

## 2018-12-14 NOTE — ED Triage Notes (Signed)
Fever for a few days. Body aches and cough.

## 2018-12-15 ENCOUNTER — Emergency Department (HOSPITAL_BASED_OUTPATIENT_CLINIC_OR_DEPARTMENT_OTHER)
Admission: EM | Admit: 2018-12-15 | Discharge: 2018-12-15 | Disposition: A | Discharge status: Home / Self Care | Payer: Self-pay | Attending: Emergency Medicine | Admitting: Emergency Medicine

## 2018-12-15 NOTE — ED Notes (Signed)
Pt called  No response from lobby  

## 2019-03-14 DIAGNOSIS — R197 Diarrhea, unspecified: Secondary | ICD-10-CM | POA: Insufficient documentation

## 2019-03-14 DIAGNOSIS — F1990 Other psychoactive substance use, unspecified, uncomplicated: Secondary | ICD-10-CM | POA: Insufficient documentation

## 2019-03-14 DIAGNOSIS — A4101 Sepsis due to Methicillin susceptible Staphylococcus aureus: Secondary | ICD-10-CM | POA: Insufficient documentation

## 2019-03-14 DIAGNOSIS — R6521 Severe sepsis with septic shock: Secondary | ICD-10-CM | POA: Insufficient documentation

## 2019-03-14 DIAGNOSIS — N170 Acute kidney failure with tubular necrosis: Secondary | ICD-10-CM | POA: Insufficient documentation

## 2019-03-14 DIAGNOSIS — E876 Hypokalemia: Secondary | ICD-10-CM | POA: Insufficient documentation

## 2019-03-14 DIAGNOSIS — J9601 Acute respiratory failure with hypoxia: Secondary | ICD-10-CM | POA: Insufficient documentation

## 2019-03-14 DIAGNOSIS — E860 Dehydration: Secondary | ICD-10-CM | POA: Insufficient documentation

## 2019-03-14 DIAGNOSIS — E871 Hypo-osmolality and hyponatremia: Secondary | ICD-10-CM | POA: Insufficient documentation

## 2019-03-14 DIAGNOSIS — R7989 Other specified abnormal findings of blood chemistry: Secondary | ICD-10-CM | POA: Insufficient documentation

## 2019-03-15 DIAGNOSIS — I2690 Septic pulmonary embolism without acute cor pulmonale: Secondary | ICD-10-CM | POA: Insufficient documentation

## 2019-03-15 DIAGNOSIS — I071 Rheumatic tricuspid insufficiency: Secondary | ICD-10-CM | POA: Insufficient documentation

## 2019-03-15 DIAGNOSIS — I340 Nonrheumatic mitral (valve) insufficiency: Secondary | ICD-10-CM | POA: Insufficient documentation

## 2019-03-15 DIAGNOSIS — I330 Acute and subacute infective endocarditis: Secondary | ICD-10-CM | POA: Insufficient documentation

## 2019-03-30 DIAGNOSIS — M00061 Staphylococcal arthritis, right knee: Secondary | ICD-10-CM | POA: Insufficient documentation

## 2019-03-30 DIAGNOSIS — M009 Pyogenic arthritis, unspecified: Secondary | ICD-10-CM | POA: Insufficient documentation

## 2019-03-30 DIAGNOSIS — M86112 Other acute osteomyelitis, left shoulder: Secondary | ICD-10-CM | POA: Insufficient documentation

## 2020-01-27 ENCOUNTER — Other Ambulatory Visit: Payer: Self-pay

## 2020-01-27 ENCOUNTER — Other Ambulatory Visit: Payer: Self-pay | Admitting: Infectious Disease

## 2020-01-27 DIAGNOSIS — Z113 Encounter for screening for infections with a predominantly sexual mode of transmission: Secondary | ICD-10-CM

## 2020-01-27 DIAGNOSIS — Z79899 Other long term (current) drug therapy: Secondary | ICD-10-CM

## 2020-01-27 DIAGNOSIS — B20 Human immunodeficiency virus [HIV] disease: Secondary | ICD-10-CM

## 2020-01-27 LAB — T-HELPER CELL (CD4) - (RCID CLINIC ONLY)
CD4 % Helper T Cell: 2 % — ABNORMAL LOW (ref 33–65)
CD4 T Cell Abs: <35 /uL — ABNORMAL LOW (ref 400–1790)

## 2020-01-30 LAB — URINE CYTOLOGY ANCILLARY ONLY
Chlamydia: NEGATIVE
Comment: NEGATIVE
Comment: NORMAL
Neisseria Gonorrhea: NEGATIVE

## 2020-01-31 LAB — CBC WITH DIFFERENTIAL/PLATELET
Absolute Monocytes: 322 cells/uL (ref 200–950)
Basophils Absolute: 31 cells/uL (ref 0–200)
Basophils Relative: 0.6 %
Eosinophils Absolute: 582 cells/uL — ABNORMAL HIGH (ref 15–500)
Eosinophils Relative: 11.2 %
HCT: 30.9 % — ABNORMAL LOW (ref 38.5–50.0)
Hemoglobin: 10.3 g/dL — ABNORMAL LOW (ref 13.2–17.1)
Lymphs Abs: 1321 cells/uL (ref 850–3900)
MCH: 28.5 pg (ref 27.0–33.0)
MCHC: 33.3 g/dL (ref 32.0–36.0)
MCV: 85.6 fL (ref 80.0–100.0)
MPV: 10.2 fL (ref 7.5–12.5)
Monocytes Relative: 6.2 %
Neutro Abs: 2943 cells/uL (ref 1500–7800)
Neutrophils Relative %: 56.6 %
Platelets: 211 10*3/uL (ref 140–400)
RBC: 3.61 10*6/uL — ABNORMAL LOW (ref 4.20–5.80)
RDW: 13.1 % (ref 11.0–15.0)
Total Lymphocyte: 25.4 %
WBC: 5.2 10*3/uL (ref 3.8–10.8)

## 2020-01-31 LAB — LIPID PANEL
Cholesterol: 117 mg/dL (ref <200)
HDL: 28 mg/dL — ABNORMAL LOW (ref >=40)
LDL Cholesterol (Calc): 69 mg/dL (calc)
Non-HDL Cholesterol (Calc): 89 mg/dL (calc) (ref <130)
Total CHOL/HDL Ratio: 4.2 (calc) (ref <5.0)
Triglycerides: 110 mg/dL (ref <150)

## 2020-01-31 LAB — COMPLETE METABOLIC PANEL WITH GFR
AG Ratio: 0.7 (calc) — ABNORMAL LOW (ref 1.0–2.5)
ALT: 12 U/L (ref 9–46)
AST: 24 U/L (ref 10–40)
Albumin: 3.3 g/dL — ABNORMAL LOW (ref 3.6–5.1)
Alkaline phosphatase (APISO): 67 U/L (ref 36–130)
BUN: 14 mg/dL (ref 7–25)
CO2: 27 mmol/L (ref 20–32)
Calcium: 8.5 mg/dL — ABNORMAL LOW (ref 8.6–10.3)
Chloride: 96 mmol/L — ABNORMAL LOW (ref 98–110)
Creat: 0.86 mg/dL (ref 0.60–1.35)
GFR, Est African American: 128 mL/min/{1.73_m2} (ref >=60)
GFR, Est Non African American: 111 mL/min/{1.73_m2} (ref >=60)
Globulin: 4.8 g/dL (calc) — ABNORMAL HIGH (ref 1.9–3.7)
Glucose, Bld: 104 mg/dL — ABNORMAL HIGH (ref 65–99)
Potassium: 3.2 mmol/L — ABNORMAL LOW (ref 3.5–5.3)
Sodium: 134 mmol/L — ABNORMAL LOW (ref 135–146)
Total Bilirubin: 0.4 mg/dL (ref 0.2–1.2)
Total Protein: 8.1 g/dL (ref 6.1–8.1)

## 2020-01-31 LAB — RPR TITER: RPR Titer: 1:512 {titer} — ABNORMAL HIGH

## 2020-01-31 LAB — RPR: RPR Ser Ql: REACTIVE — AB

## 2020-01-31 LAB — HIV-1 RNA QUANT-NO REFLEX-BLD
HIV 1 RNA Quant: 228000 copies/mL — ABNORMAL HIGH
HIV-1 RNA Quant, Log: 5.36 Log copies/mL — ABNORMAL HIGH

## 2020-01-31 LAB — FLUORESCENT TREPONEMAL AB(FTA)-IGG-BLD: Fluorescent Treponemal ABS: REACTIVE — AB

## 2020-02-01 ENCOUNTER — Telehealth: Payer: Self-pay

## 2020-02-01 NOTE — Telephone Encounter (Signed)
Albert: DIS called office today to follow up on RPR collected on 3/26. Lab still pending; unable to see results in Epic.  Provided contact information for patient; DIS will follow up with patient to see if he has been treated in the past.  Lorenso Courier, CMA

## 2020-02-13 ENCOUNTER — Ambulatory Visit: Payer: Self-pay

## 2020-02-13 ENCOUNTER — Other Ambulatory Visit: Payer: Self-pay

## 2020-02-13 ENCOUNTER — Ambulatory Visit (INDEPENDENT_AMBULATORY_CARE_PROVIDER_SITE_OTHER): Payer: Self-pay | Admitting: Infectious Disease

## 2020-02-13 ENCOUNTER — Encounter: Payer: Self-pay | Admitting: Infectious Disease

## 2020-02-13 VITALS — BP 115/83 | HR 106 | Wt 145.0 lb

## 2020-02-13 DIAGNOSIS — B20 Human immunodeficiency virus [HIV] disease: Secondary | ICD-10-CM

## 2020-02-13 DIAGNOSIS — A5143 Secondary syphilitic oculopathy: Secondary | ICD-10-CM

## 2020-02-13 DIAGNOSIS — A529 Late syphilis, unspecified: Secondary | ICD-10-CM

## 2020-02-13 MED ORDER — PENICILLIN G BENZATHINE 1200000 UNIT/2ML IM SUSP
1.2000 10*6.[IU] | Freq: Once | INTRAMUSCULAR | Status: AC
  Administered 2020-02-13: 1.2 10*6.[IU] via INTRAMUSCULAR

## 2020-02-13 MED ORDER — SULFAMETHOXAZOLE-TRIMETHOPRIM 800-160 MG PO TABS: 1.0000 | ORAL_TABLET | Freq: Every day | ORAL | 11 refills | Status: DC

## 2020-02-13 MED ORDER — BIKTARVY 50-200-25 MG PO TABS: 1.0000 | ORAL_TABLET | Freq: Every day | ORAL | 11 refills | Status: DC

## 2020-02-13 NOTE — Addendum Note (Signed)
Addended by: Rosanna Randy on: 02/13/2020 10:50 AM   Modules accepted: Orders

## 2020-02-13 NOTE — Progress Notes (Signed)
Subjective:  Chief complaint: Anxiety about his HIV status and syphilis    Patient ID: Dalton Rodriguez, male    DOB: Apr 03, 1981, 39 y.o.   MRN: [id number]  HPI   Dalton Rodriguez is a 39 year old Caucasian man living with HIV whom we took care of roughly 3 years ago when he had also syphilitic uveitis and was treated with 2 weeks of IV penicillin.  He does have a comorbid history of IV drug use.  He has been living in [place] and return to the area a little over a year and a half ago.  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  It sounds as if his knee was aspirated I do not see documentation of him having formal I&D.  He was given vancomycin during the hospital and then later changed over to Zyvox.  He was not seen in follow-up since then.  CD4 count during that hospitalization was 50.  While inpatient he was placed on Triumeq and CD4 rose to 341.  He now is return to care here with [place].  When I talked to him he claimed that he was not using IV drugs anymore.  I asked him how that was the case he said that him being involved in caring for his father's primary caregiver head pushed him to have to stop using intravenous drugs.  He came here for labs at intake which did not crossover into the electronic medical record.  CD4 count is less than 35 and viral load well over 200,000.  His syphilis titer is now 1-512 which is greater than it was when he had syphilitic uveitis.  He is anxious to restart antiretroviral therapy.  Past Medical History:  Diagnosis Date  . HIV disease ([place]) 02/12/2017  . 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 WISDOM TOOTH EXTRACTION      No family history on file.    Social History   Socioeconomic History  . Marital status:  Married    Spouse name: Not on file  . Number of children: Not on file  . Years of education: Not on file  . Highest education level: Not on file  Occupational History  . Not on file  Tobacco Use  . Smoking status: Never Smoker  . Smokeless tobacco: Never Used  Substance and Sexual Activity  . Alcohol use: Yes    Comment: 3-4 drinks a week  . Drug use: No  . Sexual activity: Yes    Partners: Male    Birth control/protection: Condom  Other Topics Concern  . Not on file  Social History Narrative  . Not on file   Social Determinants of Health   Financial Resource Strain:   . Difficulty of Paying Living Expenses:   Food Insecurity:   . Worried About Charity fundraiser in the Last Year:   . Arboriculturist in the Last Year:   Transportation Needs:   . Film/video editor (Medical):   Marland Kitchen Lack of Transportation (Non-Medical):   Physical Activity:   . Days of Exercise per Week:   . Minutes of Exercise per Session:   Stress:   . Feeling of Stress :   Social Connections:   . Frequency of Communication with Friends and Family:   .  Frequency of Social Gatherings with Friends and Family:   . Attends Religious Services:   . Active Member of Clubs or Organizations:   . Attends Archivist Meetings:   Marland Kitchen Marital Status:     No Known Allergies   Current Outpatient Medications:  .  abacavir-dolutegravir-lamiVUDine (TRIUMEQ) 600-50-300 MG tablet, Take by mouth., Disp: , Rfl:  .  bictegravir-emtricitabine-tenofovir AF (BIKTARVY) 50-200-25 MG TABS tablet, Take 1 tablet by mouth daily., Disp: 30 tablet, Rfl: 11 .  penicillin G IVPB, Inject 24 Million Units into the vein continuous. Indication:  Ocular syphilis Last Day of Therapy:  4/26 Labs - Once weekly:  CBC/D and BMP, Labs - Every other week:  ESR and CRP (Patient not taking: Reported on 02/13/2020), Disp: 14 Units, Rfl: 0 .  sulfamethoxazole-trimethoprim (BACTRIM DS) 800-160 MG tablet, Take 1 tablet by mouth daily.,  Disp: 30 tablet, Rfl: 11     Review of Systems  Constitutional: Negative for activity change, appetite change, chills, diaphoresis, fatigue, fever and unexpected weight change.  HENT: Negative for congestion, rhinorrhea, sinus pressure, sneezing, sore throat and trouble swallowing.   Eyes: Negative for photophobia and visual disturbance.  Respiratory: Negative for cough, chest tightness, shortness of breath, wheezing and stridor.   Cardiovascular: Negative for chest pain, palpitations and leg swelling.  Gastrointestinal: Negative for abdominal distention, abdominal pain, anal bleeding, blood in stool, constipation, diarrhea, nausea and vomiting.  Genitourinary: Negative for difficulty urinating, dysuria, flank pain and hematuria.  Musculoskeletal: Negative for arthralgias, back pain, gait problem, joint swelling and myalgias.  Skin: Negative for color change, pallor, rash and wound.  Neurological: Negative for dizziness, tremors, weakness and light-headedness.  Hematological: Negative for adenopathy. Does not bruise/bleed easily.  Psychiatric/Behavioral: Positive for dysphoric mood. Negative for agitation, behavioral problems, confusion, decreased concentration, self-injury and sleep disturbance. The patient is nervous/anxious.        Objective:   Physical Exam Constitutional:      Appearance: He is underweight.  HENT:     Head: Normocephalic and atraumatic.  Eyes:     Conjunctiva/sclera: Conjunctivae normal.  Cardiovascular:     Rate and Rhythm: Normal rate and regular rhythm.  Pulmonary:     [place]: Pulmonary effort is normal. No respiratory distress.     Breath sounds: No wheezing.  Abdominal:     General: There is no distension.     Palpations: Abdomen is soft.  Musculoskeletal:        General: No tenderness. Normal range of motion.     Cervical back: Normal range of motion and neck supple.  Skin:    General: Skin is warm and dry.     Coloration: Skin is not pale.      Findings: No erythema or rash.  Neurological:     General: No focal deficit present.     Mental Status: He is alert and oriented to person, place, and time.  Psychiatric:        Mood and Affect: Mood is anxious.        Speech: Speech normal.        Behavior: Behavior normal.        Thought Content: Thought content normal.        Judgment: Judgment normal.           Assessment & Plan:  HIV and AIDS: We will start [place] and I gave him for bottles of 7 tablets samples from [place].  I have sent prescriptions for this Biktarvy as well as Bactrim  to the [facility] on [address].  We will try to add a genotype to the viral load that he had done a few weeks ago if not we will have a new viral load with reflex genotype  Hepatitis B and C  IV drug use: I am concerned that he still might be using certainly he was likely using last year when he had MSSA bacteremia and metastatic infection  History of MSSA bacteremia with right-sided endocarditis septic emboli the lungs sternoclavicular septic arthritis left and right septic knee:  I was unaware of these problems when he came to visit but discovered them on looking through the electronic medical records after his visit.  I like to revisit this history and make sure he does not have any clinical evidence of recurrent staphylococcal infection.  Syphilis: Titers 1-512 I am concerned he could have neurosyphilis again.  He was not anxious to have a lumbar puncture.  I think it went down that route he required IV penicillin we needed hospitalize him and treat him as an inpatient.  For now we will give him IM penicillin weekly x3 weeks.  There is some data on using 200 mg of doxycycline twice daily for 21 days which is another alternative regimen considered but I think for him I would give him IV penicillin if needed

## 2020-02-14 LAB — URINE CYTOLOGY ANCILLARY ONLY
Chlamydia: NEGATIVE
Comment: NEGATIVE
Comment: NORMAL
Neisseria Gonorrhea: NEGATIVE

## 2020-02-16 ENCOUNTER — Telehealth: Payer: Self-pay

## 2020-02-16 NOTE — Telephone Encounter (Signed)
Dalton Rodriguez, DIS called office to follow up on treatment for patient and his recent infection with Syphillis. Would like to know if patient was treated and if he would be doing full 3 course injections or one time dose Informed her patient was treated on 4/12 and is scheduled to come back for the next two weeks for bicillin. Would like to know if patient is married or not to follow up on contacts. Per chart patient is married. Would like for office to confirm if he is married or not.  Lorenso Courier, [place]

## 2020-02-20 ENCOUNTER — Other Ambulatory Visit: Payer: Self-pay

## 2020-02-20 ENCOUNTER — Ambulatory Visit (INDEPENDENT_AMBULATORY_CARE_PROVIDER_SITE_OTHER): Payer: Self-pay

## 2020-02-20 DIAGNOSIS — A529 Late syphilis, unspecified: Secondary | ICD-10-CM

## 2020-02-20 MED ORDER — PENICILLIN G BENZATHINE 1200000 UNIT/2ML IM SUSP
1.2000 10*6.[IU] | Freq: Once | INTRAMUSCULAR | Status: AC
  Administered 2020-02-20: 1.2 10*6.[IU] via INTRAMUSCULAR

## 2020-02-26 LAB — HIV-1 GENOTYPING (RTI,PI,IN INHBTR): HIV-1 Genotype: DETECTED — AB

## 2020-02-26 LAB — HIV-1 INTEGRASE GENOTYPE

## 2020-02-26 LAB — HEPATITIS B SURFACE ANTIBODY,QUALITATIVE: Hep B S Ab: REACTIVE — AB

## 2020-02-26 LAB — HIV RNA, RTPCR W/R GT (RTI, PI,INT)
HIV 1 RNA Quant: 3070000 copies/mL — ABNORMAL HIGH
HIV-1 RNA Quant, Log: 6.49 Log copies/mL — ABNORMAL HIGH

## 2020-02-26 LAB — HEPATITIS B SURFACE ANTIGEN: Hepatitis B Surface Ag: NONREACTIVE

## 2020-02-26 LAB — HEPATITIS C ANTIBODY
Hepatitis C Ab: NONREACTIVE
SIGNAL TO CUT-OFF: 0.15 (ref <1.00)

## 2020-02-26 LAB — HEPATITIS A ANTIBODY, TOTAL: Hepatitis A AB,Total: NONREACTIVE

## 2020-02-27 ENCOUNTER — Ambulatory Visit (INDEPENDENT_AMBULATORY_CARE_PROVIDER_SITE_OTHER): Payer: Self-pay | Admitting: *Deleted

## 2020-02-27 ENCOUNTER — Other Ambulatory Visit: Payer: Self-pay

## 2020-02-27 DIAGNOSIS — A529 Late syphilis, unspecified: Secondary | ICD-10-CM

## 2020-02-27 MED ORDER — PENICILLIN G BENZATHINE 1200000 UNIT/2ML IM SUSP
1.2000 10*6.[IU] | Freq: Once | INTRAMUSCULAR | Status: AC
  Administered 2020-02-27: 12:00:00 1.2 10*6.[IU] via INTRAMUSCULAR

## 2020-02-27 MED ORDER — PENICILLIN G BENZATHINE 1200000 UNIT/2ML IM SUSP
1.2000 10*6.[IU] | Freq: Once | INTRAMUSCULAR | Status: AC
  Administered 2020-02-27: 1.2 10*6.[IU] via INTRAMUSCULAR

## 2020-02-27 NOTE — Progress Notes (Signed)
RN offered condoms, advised patient to remain abstinent for 7-10 days after treatment, and that the health department would be following up with him to confirm treatment. Patient's questions answered to their satisfaction. Esti Demello M, RN  

## 2020-03-26 ENCOUNTER — Ambulatory Visit: Payer: Self-pay | Admitting: Infectious Disease

## 2020-04-23 ENCOUNTER — Ambulatory Visit: Payer: Self-pay | Admitting: Infectious Disease

## 2020-04-23 ENCOUNTER — Telehealth: Payer: Self-pay | Admitting: *Deleted

## 2020-04-23 NOTE — Telephone Encounter (Signed)
RN left message asking patient to reschedule today's missed visit. Jamal Haskin M, RN    

## 2020-06-07 ENCOUNTER — Other Ambulatory Visit: Payer: Self-pay

## 2020-06-07 ENCOUNTER — Ambulatory Visit: Payer: Self-pay

## 2020-06-07 DIAGNOSIS — A5143 Secondary syphilitic oculopathy: Secondary | ICD-10-CM

## 2020-06-07 DIAGNOSIS — B20 Human immunodeficiency virus [HIV] disease: Secondary | ICD-10-CM

## 2020-06-07 DIAGNOSIS — A529 Late syphilis, unspecified: Secondary | ICD-10-CM

## 2020-06-08 LAB — T-HELPER CELL (CD4) - (RCID CLINIC ONLY)
CD4 % Helper T Cell: 13 % — ABNORMAL LOW (ref 33–65)
CD4 T Cell Abs: 451 /uL (ref 400–1790)

## 2020-06-11 LAB — CBC WITH DIFFERENTIAL/PLATELET
Absolute Monocytes: 343 cells/uL (ref 200–950)
Basophils Absolute: 59 cells/uL (ref 0–200)
Basophils Relative: 0.9 %
Eosinophils Absolute: 488 cells/uL (ref 15–500)
Eosinophils Relative: 7.4 %
HCT: 44.5 % (ref 38.5–50.0)
Hemoglobin: 14.9 g/dL (ref 13.2–17.1)
Lymphs Abs: 3670 cells/uL (ref 850–3900)
MCH: 29.5 pg (ref 27.0–33.0)
MCHC: 33.5 g/dL (ref 32.0–36.0)
MCV: 88.1 fL (ref 80.0–100.0)
MPV: 8.8 fL (ref 7.5–12.5)
Monocytes Relative: 5.2 %
Neutro Abs: 2039 cells/uL (ref 1500–7800)
Neutrophils Relative %: 30.9 %
Platelets: 331 10*3/uL (ref 140–400)
RBC: 5.05 10*6/uL (ref 4.20–5.80)
RDW: 12.5 % (ref 11.0–15.0)
Total Lymphocyte: 55.6 %
WBC: 6.6 10*3/uL (ref 3.8–10.8)

## 2020-06-11 LAB — COMPLETE METABOLIC PANEL WITH GFR
AG Ratio: 1.1 (calc) (ref 1.0–2.5)
ALT: 18 U/L (ref 9–46)
AST: 22 U/L (ref 10–40)
Albumin: 4.1 g/dL (ref 3.6–5.1)
Alkaline phosphatase (APISO): 85 U/L (ref 36–130)
BUN: 15 mg/dL (ref 7–25)
CO2: 27 mmol/L (ref 20–32)
Calcium: 9.1 mg/dL (ref 8.6–10.3)
Chloride: 103 mmol/L (ref 98–110)
Creat: 0.83 mg/dL (ref 0.60–1.35)
GFR, Est African American: 130 mL/min/{1.73_m2} (ref >=60)
GFR, Est Non African American: 112 mL/min/{1.73_m2} (ref >=60)
Globulin: 3.9 g/dL (calc) — ABNORMAL HIGH (ref 1.9–3.7)
Glucose, Bld: 80 mg/dL (ref 65–99)
Potassium: 4.1 mmol/L (ref 3.5–5.3)
Sodium: 138 mmol/L (ref 135–146)
Total Bilirubin: 0.2 mg/dL (ref 0.2–1.2)
Total Protein: 8 g/dL (ref 6.1–8.1)

## 2020-06-11 LAB — FLUORESCENT TREPONEMAL AB(FTA)-IGG-BLD: Fluorescent Treponemal ABS: REACTIVE — AB

## 2020-06-11 LAB — HIV-1 RNA QUANT-NO REFLEX-BLD
HIV 1 RNA Quant: 4600 Copies/mL — ABNORMAL HIGH
HIV-1 RNA Quant, Log: 3.66 Log cps/mL — ABNORMAL HIGH

## 2020-06-11 LAB — RPR: RPR Ser Ql: REACTIVE — AB

## 2020-06-11 LAB — RPR TITER: RPR Titer: 1:32 {titer} — ABNORMAL HIGH

## 2020-07-06 ENCOUNTER — Encounter: Payer: Self-pay | Admitting: Infectious Disease

## 2020-07-16 ENCOUNTER — Encounter: Payer: Self-pay | Admitting: Infectious Disease

## 2020-11-06 ENCOUNTER — Other Ambulatory Visit: Payer: Self-pay

## 2020-11-06 ENCOUNTER — Ambulatory Visit: Payer: Self-pay

## 2020-11-06 DIAGNOSIS — Z79899 Other long term (current) drug therapy: Secondary | ICD-10-CM

## 2020-11-06 DIAGNOSIS — B20 Human immunodeficiency virus [HIV] disease: Secondary | ICD-10-CM

## 2020-11-06 DIAGNOSIS — Z113 Encounter for screening for infections with a predominantly sexual mode of transmission: Secondary | ICD-10-CM

## 2020-11-07 LAB — T-HELPER CELL (CD4) - (RCID CLINIC ONLY)
CD4 % Helper T Cell: 14 % — ABNORMAL LOW (ref 33–65)
CD4 T Cell Abs: 815 /uL (ref 400–1790)

## 2020-11-14 LAB — CBC WITH DIFFERENTIAL/PLATELET
Absolute Monocytes: 779 cells/uL (ref 200–950)
Basophils Absolute: 132 cells/uL (ref 0–200)
Basophils Relative: 1 %
Eosinophils Absolute: 1465 cells/uL — ABNORMAL HIGH (ref 15–500)
Eosinophils Relative: 11.1 %
HCT: 39.2 % (ref 38.5–50.0)
Hemoglobin: 13.5 g/dL (ref 13.2–17.1)
Lymphs Abs: 6362 cells/uL — ABNORMAL HIGH (ref 850–3900)
MCH: 29.3 pg (ref 27.0–33.0)
MCHC: 34.4 g/dL (ref 32.0–36.0)
MCV: 85.2 fL (ref 80.0–100.0)
MPV: 8.7 fL (ref 7.5–12.5)
Monocytes Relative: 5.9 %
Neutro Abs: 4462 cells/uL (ref 1500–7800)
Neutrophils Relative %: 33.8 %
Platelets: 406 10*3/uL — ABNORMAL HIGH (ref 140–400)
RBC: 4.6 10*6/uL (ref 4.20–5.80)
RDW: 12.8 % (ref 11.0–15.0)
Total Lymphocyte: 48.2 %
WBC: 13.2 10*3/uL — ABNORMAL HIGH (ref 3.8–10.8)

## 2020-11-14 LAB — HIV-1 RNA QUANT-NO REFLEX-BLD
HIV 1 RNA Quant: 230 Copies/mL — ABNORMAL HIGH
HIV-1 RNA Quant, Log: 2.36 Log cps/mL — ABNORMAL HIGH

## 2020-11-14 LAB — COMPLETE METABOLIC PANEL WITH GFR
AG Ratio: 1.1 (calc) (ref 1.0–2.5)
ALT: 11 U/L (ref 9–46)
AST: 13 U/L (ref 10–40)
Albumin: 4.5 g/dL (ref 3.6–5.1)
Alkaline phosphatase (APISO): 92 U/L (ref 36–130)
BUN: 20 mg/dL (ref 7–25)
CO2: 31 mmol/L (ref 20–32)
Calcium: 9.5 mg/dL (ref 8.6–10.3)
Chloride: 100 mmol/L (ref 98–110)
Creat: 1.07 mg/dL (ref 0.60–1.35)
GFR, Est African American: 102 mL/min/{1.73_m2} (ref >=60)
GFR, Est Non African American: 88 mL/min/{1.73_m2} (ref >=60)
Globulin: 4.2 g/dL (calc) — ABNORMAL HIGH (ref 1.9–3.7)
Glucose, Bld: 85 mg/dL (ref 65–99)
Potassium: 4.2 mmol/L (ref 3.5–5.3)
Sodium: 136 mmol/L (ref 135–146)
Total Bilirubin: 0.3 mg/dL (ref 0.2–1.2)
Total Protein: 8.7 g/dL — ABNORMAL HIGH (ref 6.1–8.1)

## 2020-11-14 LAB — RPR TITER: RPR Titer: 1:64 {titer} — ABNORMAL HIGH

## 2020-11-14 LAB — LIPID PANEL
Cholesterol: 225 mg/dL — ABNORMAL HIGH (ref <200)
HDL: 40 mg/dL (ref >=40)
LDL Cholesterol (Calc): 149 mg/dL (calc) — ABNORMAL HIGH
Non-HDL Cholesterol (Calc): 185 mg/dL (calc) — ABNORMAL HIGH (ref <130)
Total CHOL/HDL Ratio: 5.6 (calc) — ABNORMAL HIGH (ref <5.0)
Triglycerides: 214 mg/dL — ABNORMAL HIGH (ref <150)

## 2020-11-14 LAB — FLUORESCENT TREPONEMAL AB(FTA)-IGG-BLD: Fluorescent Treponemal ABS: REACTIVE — AB

## 2020-11-14 LAB — RPR: RPR Ser Ql: REACTIVE — AB

## 2020-11-23 ENCOUNTER — Encounter: Payer: Self-pay | Admitting: Infectious Disease

## 2020-11-23 ENCOUNTER — Ambulatory Visit (INDEPENDENT_AMBULATORY_CARE_PROVIDER_SITE_OTHER): Payer: Self-pay | Admitting: Infectious Disease

## 2020-11-23 ENCOUNTER — Other Ambulatory Visit: Payer: Self-pay

## 2020-11-23 VITALS — BP 133/85 | HR 85 | Temp 98.9°F | Resp 16 | Ht 72.0 in | Wt 200.4 lb

## 2020-11-23 DIAGNOSIS — B20 Human immunodeficiency virus [HIV] disease: Secondary | ICD-10-CM

## 2020-11-23 DIAGNOSIS — F331 Major depressive disorder, recurrent, moderate: Secondary | ICD-10-CM

## 2020-11-23 DIAGNOSIS — F4321 Adjustment disorder with depressed mood: Secondary | ICD-10-CM

## 2020-11-23 DIAGNOSIS — R21 Rash and other nonspecific skin eruption: Secondary | ICD-10-CM

## 2020-11-23 DIAGNOSIS — A5143 Secondary syphilitic oculopathy: Secondary | ICD-10-CM

## 2020-11-23 DIAGNOSIS — N170 Acute kidney failure with tubular necrosis: Secondary | ICD-10-CM

## 2020-11-23 DIAGNOSIS — F1990 Other psychoactive substance use, unspecified, uncomplicated: Secondary | ICD-10-CM

## 2020-11-23 DIAGNOSIS — R6521 Severe sepsis with septic shock: Secondary | ICD-10-CM

## 2020-11-23 DIAGNOSIS — I330 Acute and subacute infective endocarditis: Secondary | ICD-10-CM

## 2020-11-23 DIAGNOSIS — M86112 Other acute osteomyelitis, left shoulder: Secondary | ICD-10-CM

## 2020-11-23 DIAGNOSIS — A4101 Sepsis due to Methicillin susceptible Staphylococcus aureus: Secondary | ICD-10-CM

## 2020-11-23 HISTORY — DX: Major depressive disorder, recurrent, moderate: F33.1

## 2020-11-23 HISTORY — DX: Adjustment disorder with depressed mood: F43.21

## 2020-11-23 MED ORDER — DOXYCYCLINE HYCLATE 100 MG PO TABS: 100.0000 mg | ORAL_TABLET | Freq: Two times a day (BID) | ORAL | 0 refills | Status: AC

## 2020-11-23 NOTE — Progress Notes (Signed)
Subjective:  Chief complaint: Anxiety about his HIV status and syphilis    Patient ID: Dalton Rodriguez, male    DOB: 10-Nov-1980, 40 y.o.   MRN: [id number]  HPI   Dalton Rodriguez is a 40 year old Caucasian man living with HIV whom we took care of roughly 3 years ago when he had also syphilitic uveitis and was treated with 2 weeks of IV penicillin.  He does have a comorbid history of IV drug use.  He has been living in [place] and return to the area a little over a year and a half ago.  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  It sounds as if his knee was aspirated I do not see documentation of him having formal I&D.  He was given vancomycin during the hospital and then later changed over to Zyvox.  He was not seen in follow-up since then.  CD4 count during that hospitalization was 50.  While inpatient he was placed on Triumeq and CD4 rose to 341.  He now is return to care here with [place].   He has had on again off again use of medications but now is becoming more suppressed.  He would be a candidate for the LATTITUDE study possibly if we had a recent genotype.  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  He had multiple losses this year his partner who was also an IV drug user died from an overdose of fentanyl.  Another family member died of COVID-19 infection.  The patient did admit to having had sepsis in the past due to IV drug use and claims understand the need to stop this.  He seemed quite lucid and to be doing relatively well and to have strong intention to stay on medications.  He has an rash on his arms and chest that he has noticed over the last month that has failed to disappear with corticosteroid therapy.  It is worse particular if he  takes a shower.  He has had syphilitic uveitis as mentioned the RPR he had most recently had gone up by 2 full-term 1-32 to 1-64 but not fourfold.  When I mentioned giving him injection of penicillin he asked if there was an oral antibiotic for this and so I gave him a prescription for doxycycline.  I also had a meet with Deirdre Evener our research nurse regarding the ACT G study.  Past Medical History:  Diagnosis Date  . HIV disease ([facility]) 02/12/2017  . 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 WISDOM TOOTH EXTRACTION      History reviewed. No pertinent family history.    Social History   Socioeconomic History  . Marital status: Married    Spouse name: Not on file  . Number of children: Not on file  . Years of education: Not on file  . Highest education level: Not on file  Occupational History  . Not on file  Tobacco Use  . Smoking status: Never Smoker  . Smokeless tobacco: Never Used  Substance and Sexual Activity  . Alcohol use: Yes    Comment: 3-4 drinks a week  . Drug use: No  . Sexual activity: Yes    Partners: Male    Birth control/protection:  Condom  Other Topics Concern  . Not on file  Social History Narrative  . Not on file   Social Determinants of Health   Financial Resource Strain: Not on file  Food Insecurity: Not on file  Transportation Needs: Not on file  Physical Activity: Not on file  Stress: Not on file  Social Connections: Not on file    Allergies  Allergen Reactions  . Ibuprofen      Current Outpatient Medications:  .  bictegravir-emtricitabine-tenofovir AF (BIKTARVY) 50-200-25 MG TABS tablet, Take 1 tablet by mouth daily., Disp: 30 tablet, Rfl: 11 .  doxycycline (VIBRA-TABS) 100 MG tablet, Take 1 tablet (100 mg total) by mouth 2 (two) times daily for 28 days., Disp: 56 tablet, Rfl: 0     Review of Systems  Constitutional: Negative for activity change, appetite change, chills, diaphoresis,  fatigue, fever and unexpected weight change.  HENT: Negative for congestion, rhinorrhea, sinus pressure, sneezing, sore throat and trouble swallowing.   Eyes: Negative for photophobia and visual disturbance.  Respiratory: Negative for cough, chest tightness, shortness of breath, wheezing and stridor.   Cardiovascular: Negative for chest pain, palpitations and leg swelling.  Gastrointestinal: Negative for abdominal distention, abdominal pain, anal bleeding, blood in stool, constipation, diarrhea, nausea and vomiting.  Genitourinary: Negative for difficulty urinating, dysuria, flank pain and hematuria.  Musculoskeletal: Negative for arthralgias, back pain, gait problem, joint swelling and myalgias.  Skin: Negative for color change, pallor, rash and wound.  Neurological: Negative for dizziness, tremors, syncope, speech difficulty, weakness and light-headedness.  Hematological: Negative for adenopathy. Does not bruise/bleed easily.  Psychiatric/Behavioral: Positive for dysphoric mood. Negative for agitation, behavioral problems, confusion, decreased concentration, self-injury and sleep disturbance. The patient is nervous/anxious.        Objective:   Physical Exam Constitutional:      Appearance: He is underweight.  HENT:     Head: Normocephalic and atraumatic.  Eyes:     Conjunctiva/sclera: Conjunctivae normal.  Cardiovascular:     Rate and Rhythm: Normal rate and regular rhythm.  Pulmonary:     [place]: Pulmonary effort is normal. No respiratory distress.     Breath sounds: No wheezing.  Abdominal:     General: There is no distension.     Palpations: Abdomen is soft.  Musculoskeletal:        General: No tenderness. Normal range of motion.     Cervical back: Normal range of motion and neck supple.  Skin:    General: Skin is warm and dry.     Coloration: Skin is not pale.     Findings: No erythema or rash.  Neurological:     General: No focal deficit present.     Mental Status: He  is alert and oriented to person, place, and time.  Psychiatric:        Attention and Perception: Attention normal.        Mood and Affect: Mood normal.        Speech: Speech normal.        Behavior: Behavior normal.        Thought Content: Thought content normal.        Judgment: Judgment normal.    Rash on arms 11/23/2020:           Assessment & Plan:  HIV and AIDS: had him meet with Selena Batten and after we recheck labs for HIV genotype and possibly enroll him into LATTITUDE  The interim continue his Biktarvy  IV drug use: Claims to be  clean at this point in time.  History of MSSA bacteremia with right-sided endocarditis septic emboli the lungs sternoclavicular septic arthritis left and right septic knee: No evidence that these are active at this point in time  Syphilis: We will retreat him with doxycycline may need to give him IM penicillin he does not have evidence of ocular or neurosyphilis at this point in time clinically.  Grieving, depression and anxiety: offered to have him meet w Marylu Lund

## 2020-12-04 ENCOUNTER — Encounter: Payer: Self-pay | Admitting: *Deleted

## 2021-01-23 ENCOUNTER — Ambulatory Visit: Payer: Self-pay | Admitting: Infectious Disease

## 2021-02-01 ENCOUNTER — Encounter: Payer: Self-pay | Admitting: Infectious Disease

## 2021-02-28 ENCOUNTER — Other Ambulatory Visit: Payer: Self-pay | Admitting: Infectious Disease

## 2021-02-28 DIAGNOSIS — B20 Human immunodeficiency virus [HIV] disease: Secondary | ICD-10-CM

## 2021-03-07 ENCOUNTER — Telehealth: Payer: Self-pay

## 2021-03-07 NOTE — Telephone Encounter (Signed)
Called patient to get an appointment scheduled, voicemail is full  

## 2021-04-15 ENCOUNTER — Other Ambulatory Visit: Payer: Self-pay | Admitting: Family

## 2021-04-15 DIAGNOSIS — B20 Human immunodeficiency virus [HIV] disease: Secondary | ICD-10-CM

## 2021-06-25 ENCOUNTER — Other Ambulatory Visit: Payer: Self-pay

## 2021-06-25 ENCOUNTER — Emergency Department (HOSPITAL_COMMUNITY)
Admission: EM | Admit: 2021-06-25 | Discharge: 2021-06-25 | Disposition: A | Discharge status: Home / Self Care | Payer: Self-pay | Attending: Emergency Medicine | Admitting: Emergency Medicine

## 2021-06-25 ENCOUNTER — Encounter (HOSPITAL_COMMUNITY): Payer: Self-pay | Admitting: Emergency Medicine

## 2021-06-25 DIAGNOSIS — K0889 Other specified disorders of teeth and supporting structures: Secondary | ICD-10-CM

## 2021-06-25 DIAGNOSIS — S032XXA Dislocation of tooth, initial encounter: Secondary | ICD-10-CM | POA: Insufficient documentation

## 2021-06-25 DIAGNOSIS — S0993XA Unspecified injury of face, initial encounter: Secondary | ICD-10-CM

## 2021-06-25 DIAGNOSIS — Y92410 Unspecified street and highway as the place of occurrence of the external cause: Secondary | ICD-10-CM | POA: Insufficient documentation

## 2021-06-25 DIAGNOSIS — Z23 Encounter for immunization: Secondary | ICD-10-CM | POA: Insufficient documentation

## 2021-06-25 DIAGNOSIS — Z21 Asymptomatic human immunodeficiency virus [HIV] infection status: Secondary | ICD-10-CM | POA: Insufficient documentation

## 2021-06-25 DIAGNOSIS — Z79899 Other long term (current) drug therapy: Secondary | ICD-10-CM | POA: Insufficient documentation

## 2021-06-25 MED ORDER — TETANUS-DIPHTH-ACELL PERTUSSIS 5-2.5-18.5 LF-MCG/0.5 IM SUSY
0.5000 mL | PREFILLED_SYRINGE | Freq: Once | INTRAMUSCULAR | Status: AC
  Administered 2021-06-25: 0.5 mL via INTRAMUSCULAR
  Filled 2021-06-25: qty 0.5

## 2021-06-25 MED ORDER — NAPROXEN 500 MG PO TABS
500.0000 mg | ORAL_TABLET | Freq: Once | ORAL | Status: AC
  Administered 2021-06-25: 500 mg via ORAL
  Filled 2021-06-25: qty 1
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
## 2021-06-25 NOTE — Discharge Instructions (Addendum)
See the dentist as recommended. Soft diet or clear liquid diet until then.
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
## 2021-08-27 ENCOUNTER — Other Ambulatory Visit: Payer: Self-pay

## 2021-08-27 DIAGNOSIS — Z79899 Other long term (current) drug therapy: Secondary | ICD-10-CM

## 2021-08-27 DIAGNOSIS — B20 Human immunodeficiency virus [HIV] disease: Secondary | ICD-10-CM

## 2021-08-27 DIAGNOSIS — Z113 Encounter for screening for infections with a predominantly sexual mode of transmission: Secondary | ICD-10-CM

## 2021-08-28 ENCOUNTER — Other Ambulatory Visit: Payer: Self-pay

## 2021-08-28 ENCOUNTER — Ambulatory Visit: Payer: Self-pay

## 2021-08-28 ENCOUNTER — Encounter: Payer: Self-pay | Admitting: Infectious Disease

## 2021-08-28 DIAGNOSIS — B20 Human immunodeficiency virus [HIV] disease: Secondary | ICD-10-CM

## 2021-08-28 DIAGNOSIS — Z113 Encounter for screening for infections with a predominantly sexual mode of transmission: Secondary | ICD-10-CM

## 2021-08-29 LAB — URINE CYTOLOGY ANCILLARY ONLY
Chlamydia: NEGATIVE
Comment: NEGATIVE
Comment: NORMAL
Neisseria Gonorrhea: NEGATIVE

## 2021-08-29 LAB — T-HELPER CELL (CD4) - (RCID CLINIC ONLY)
CD4 % Helper T Cell: 12 % — ABNORMAL LOW (ref 33–65)
CD4 T Cell Abs: 489 /uL (ref 400–1790)

## 2021-08-30 ENCOUNTER — Telehealth: Payer: Self-pay

## 2021-08-30 NOTE — Telephone Encounter (Signed)
Patient had pharmacy call [facility] for medication refills. Patient has been out of medication since early May. He had labs drawn, VL still pending. Patient has upcoming appointment on 11/15. Routing to provider if ok to fill or wait for refill. Valarie Cones

## 2021-09-01 LAB — CBC WITH DIFFERENTIAL/PLATELET
Absolute Monocytes: 657 cells/uL (ref 200–950)
Basophils Absolute: 85 cells/uL (ref 0–200)
Basophils Relative: 0.8 %
Eosinophils Absolute: 562 cells/uL — ABNORMAL HIGH (ref 15–500)
Eosinophils Relative: 5.3 %
HCT: 43.5 % (ref 38.5–50.0)
Hemoglobin: 14 g/dL (ref 13.2–17.1)
Lymphs Abs: 4431 cells/uL — ABNORMAL HIGH (ref 850–3900)
MCH: 27.8 pg (ref 27.0–33.0)
MCHC: 32.2 g/dL (ref 32.0–36.0)
MCV: 86.5 fL (ref 80.0–100.0)
MPV: 8.9 fL (ref 7.5–12.5)
Monocytes Relative: 6.2 %
Neutro Abs: 4865 cells/uL (ref 1500–7800)
Neutrophils Relative %: 45.9 %
Platelets: 408 10*3/uL — ABNORMAL HIGH (ref 140–400)
RBC: 5.03 10*6/uL (ref 4.20–5.80)
RDW: 12.2 % (ref 11.0–15.0)
Total Lymphocyte: 41.8 %
WBC: 10.6 10*3/uL (ref 3.8–10.8)

## 2021-09-01 LAB — COMPLETE METABOLIC PANEL WITH GFR
AG Ratio: 1.1 (calc) (ref 1.0–2.5)
ALT: 11 U/L (ref 9–46)
AST: 14 U/L (ref 10–40)
Albumin: 4.1 g/dL (ref 3.6–5.1)
Alkaline phosphatase (APISO): 89 U/L (ref 36–130)
BUN: 18 mg/dL (ref 7–25)
CO2: 30 mmol/L (ref 20–32)
Calcium: 9.4 mg/dL (ref 8.6–10.3)
Chloride: 99 mmol/L (ref 98–110)
Creat: 0.88 mg/dL (ref 0.60–1.26)
Globulin: 3.7 g/dL (calc) (ref 1.9–3.7)
Glucose, Bld: 84 mg/dL (ref 65–99)
Potassium: 5 mmol/L (ref 3.5–5.3)
Sodium: 137 mmol/L (ref 135–146)
Total Bilirubin: 0.3 mg/dL (ref 0.2–1.2)
Total Protein: 7.8 g/dL (ref 6.1–8.1)
eGFR: 113 mL/min/{1.73_m2} (ref >=60)

## 2021-09-01 LAB — HIV-1 RNA QUANT-NO REFLEX-BLD
HIV 1 RNA Quant: 280000 Copies/mL — ABNORMAL HIGH
HIV-1 RNA Quant, Log: 5.45 Log cps/mL — ABNORMAL HIGH

## 2021-09-01 LAB — RPR TITER: RPR Titer: 1:128 {titer} — ABNORMAL HIGH

## 2021-09-01 LAB — RPR: RPR Ser Ql: REACTIVE — AB

## 2021-09-01 LAB — FLUORESCENT TREPONEMAL AB(FTA)-IGG-BLD: Fluorescent Treponemal ABS: REACTIVE — AB

## 2021-09-02 ENCOUNTER — Other Ambulatory Visit: Payer: Self-pay

## 2021-09-02 DIAGNOSIS — B20 Human immunodeficiency virus [HIV] disease: Secondary | ICD-10-CM

## 2021-09-02 MED ORDER — BIKTARVY 50-200-25 MG PO TABS: 1.0000 | ORAL_TABLET | Freq: Every day | ORAL | 0 refills | Status: DC

## 2021-09-02 NOTE — Telephone Encounter (Signed)
Medication refill sent to pharmacy. Per Dr. Daiva Eves patient will need 2 week of penicillin IV for syphilis treatment. Patient would like to discuss his treatment plan in detail during his next visit with Dr. Daiva Eves.

## 2021-09-12 ENCOUNTER — Telehealth: Payer: Self-pay

## 2021-09-12 NOTE — Telephone Encounter (Signed)
Error Kristyna Bradstreet M Kathleen Likins, RMA  

## 2021-09-12 NOTE — Telephone Encounter (Signed)
Attempted  to call patient to discuss lab results. Not able to reach him at this time. Voicemail is full and not accepting new messages. This is the second attempt office has tried to reach patient. Will forward to triage to follow. Has appt with MD on 11/15. Juanita Laster, RMA

## 2021-09-12 NOTE — Telephone Encounter (Signed)
-----   Message from Randall Hiss, MD sent at 09/01/2021  5:30 PM EDT ----- Needs to get 2 weeks of IV penicillin vs go through LP and all that fun stuff with his RPR back up. Can send him Biktarvy to start again. His VL is completely out of control ----- Message ----- From: Interface, [facility] Lab Results In Sent: 08/28/2021   2:48 PM EDT To: Randall Hiss, MD

## 2021-09-17 ENCOUNTER — Encounter: Payer: Self-pay | Admitting: Infectious Disease

## 2021-09-25 ENCOUNTER — Telehealth: Payer: Self-pay

## 2021-09-25 NOTE — Telephone Encounter (Signed)
Called patient to get missed appointment rescheduled, no answer and voicemail is full

## 2021-10-01 ENCOUNTER — Other Ambulatory Visit: Payer: Self-pay

## 2021-10-01 DIAGNOSIS — B20 Human immunodeficiency virus [HIV] disease: Secondary | ICD-10-CM

## 2021-10-01 MED ORDER — BIKTARVY 50-200-25 MG PO TABS
1.0000 | ORAL_TABLET | Freq: Every day | ORAL | 2 refills | Status: DC
Start: 2021-10-01 — End: 2021-12-30

## 2021-10-01 NOTE — Telephone Encounter (Signed)
FYI Referral sent to [facility] for tracking.  Valarie Cones

## 2021-10-08 ENCOUNTER — Telehealth: Payer: Self-pay

## 2021-10-08 NOTE — Telephone Encounter (Signed)
Patient mail box is full, needs to reschedule a follow up with Dr. Daiva Eves

## 2021-10-08 NOTE — Telephone Encounter (Signed)
Albert with [facility] called, says their records show patient is back living in [place]. They will let us know if they hear back from the health department in [place] as they have sent his information along to them.   Sandie Ano, RN

## 2021-10-08 NOTE — Telephone Encounter (Signed)
-----   Message from Sandie Ano, RN sent at 09/25/2021 11:00 AM EST ----- Patient no-showed appt. With Daiva Eves on 11/15 - please see if he can be rescheduled. Thanks!

## 2021-10-14 ENCOUNTER — Telehealth: Payer: Self-pay

## 2021-10-14 NOTE — Telephone Encounter (Signed)
Was able to reach patient after follow up call made regarding last Mychart message sent last month for RPR tx.  Patient agrees to come tomorrow as this is the only time he could come this week. Valarie Cones

## 2021-10-15 ENCOUNTER — Ambulatory Visit: Payer: Self-pay | Admitting: Infectious Disease

## 2021-11-11 ENCOUNTER — Ambulatory Visit: Payer: Self-pay

## 2021-11-11 ENCOUNTER — Ambulatory Visit (INDEPENDENT_AMBULATORY_CARE_PROVIDER_SITE_OTHER): Payer: Self-pay | Admitting: Internal Medicine

## 2021-11-11 ENCOUNTER — Other Ambulatory Visit: Payer: Self-pay

## 2021-11-11 VITALS — BP 135/92 | HR 106 | Temp 98.0°F | Wt 189.0 lb

## 2021-11-11 DIAGNOSIS — B20 Human immunodeficiency virus [HIV] disease: Secondary | ICD-10-CM

## 2021-11-11 DIAGNOSIS — R21 Rash and other nonspecific skin eruption: Secondary | ICD-10-CM

## 2021-11-11 DIAGNOSIS — N342 Other urethritis: Secondary | ICD-10-CM

## 2021-11-11 DIAGNOSIS — A539 Syphilis, unspecified: Secondary | ICD-10-CM

## 2021-11-11 DIAGNOSIS — Z202 Contact with and (suspected) exposure to infections with a predominantly sexual mode of transmission: Secondary | ICD-10-CM

## 2021-11-11 MED ORDER — CEFTRIAXONE SODIUM 500 MG IJ SOLR
500.0000 mg | Freq: Once | INTRAMUSCULAR | Status: AC
  Administered 2021-11-11: 500 mg via INTRAMUSCULAR

## 2021-11-11 MED ORDER — DOXYCYCLINE HYCLATE 100 MG PO TABS: 100.0000 mg | ORAL_TABLET | Freq: Two times a day (BID) | ORAL | 0 refills | Status: AC

## 2021-11-11 MED ORDER — TERBINAFINE HCL 1 % EX CREA: 1.0000 "application " | TOPICAL_CREAM | Freq: Two times a day (BID) | CUTANEOUS | 0 refills | Status: AC

## 2021-11-11 MED ORDER — PENICILLIN G BENZATHINE 1200000 UNIT/2ML IM SUSY
1.2000 10*6.[IU] | PREFILLED_SYRINGE | Freq: Once | INTRAMUSCULAR | Status: AC
  Administered 2021-11-11: 1.2 10*6.[IU] via INTRAMUSCULAR

## 2021-11-11 MED ORDER — TERBINAFINE HCL 250 MG PO TABS: 250.0000 mg | ORAL_TABLET | Freq: Every day | ORAL | 0 refills | Status: AC

## 2021-11-11 NOTE — Patient Instructions (Signed)
Treatment for urethritis/syphilis today -- Penicillin shots weekly x3 (one today -- please make 2 nurse visit for the other 2 shots) Ceftriaxone 500 mg once im Oral doxycycline 7 days twice daily    Rash -- looks like ring worm given lack of improvement with steroid. Could be syphilis but rather atypical -terbinafine tablet daily for 2 weeks -terbinafine cream bid for 2 weeks   Hiv Continue your biktarvy Labs today See dr Daiva Eves in 4-6 weeks

## 2021-11-11 NOTE — Addendum Note (Signed)
Addended by: Juanita Laster on: 11/11/2021 11:50 AM   Modules accepted: Orders
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
## 2021-11-12 LAB — CYTOLOGY, (ORAL, ANAL, URETHRAL) ANCILLARY ONLY
Chlamydia: NEGATIVE
Chlamydia: POSITIVE — AB
Comment: NEGATIVE
Comment: NEGATIVE
Comment: NORMAL
Comment: NORMAL
Neisseria Gonorrhea: POSITIVE — AB
Neisseria Gonorrhea: POSITIVE — AB

## 2021-11-13 LAB — URINE CYTOLOGY ANCILLARY ONLY
Chlamydia: POSITIVE — AB
Comment: NEGATIVE
Comment: NEGATIVE
Comment: NORMAL
Neisseria Gonorrhea: POSITIVE — AB
Trichomonas: NEGATIVE

## 2021-11-15 ENCOUNTER — Telehealth: Payer: Self-pay

## 2021-11-15 NOTE — Telephone Encounter (Signed)
Patient's urine positive for gonorrhea and chlamydia. He was treated for both at his 1/9 appointment. Called patient to make him aware of results, no answer and voicemail box full.   He is scheduled for treatment with Bicillin on 1/16 - updated appointment note to notify patient of results.   Sandie Ano, RN

## 2021-11-18 ENCOUNTER — Ambulatory Visit: Payer: Self-pay

## 2021-11-19 ENCOUNTER — Telehealth: Payer: Self-pay

## 2021-11-19 NOTE — Telephone Encounter (Signed)
Attempted to call patient x2 regarding missed nurse visit for syphillis treatment. Voicemail is full and not accepting new messages. Will send mychart message requesting call back.  Patient received first dose of Bicillin on 11/11/21. Per Rexene Alberts, NP patient will need to get second dose by tomorrow to avoid restarting treatment.  Juanita Laster, RMA

## 2021-11-20 NOTE — Telephone Encounter (Signed)
Staff has attempted to reach patient regarding the need to schedule nurse visit for syphilis treatment by today or patient will need to restart treatment. Patient has not read Mychart communication at this time and patient's mailbox is full. Routing to triage in case patient returns call. Valarie Cones

## 2021-11-22 NOTE — Telephone Encounter (Signed)
Attempted to call patient regarding missed appt. Voicemail is full and not accepting messages. He has not viewed [employer]. Will forward message to MD for next steps.  Will forward patient information to [facility] for assistance in getting him back to office. Leatrice Jewels, RMA

## 2021-11-25 ENCOUNTER — Ambulatory Visit: Payer: Self-pay

## 2021-12-11 NOTE — Progress Notes (Signed)
Attempted to contact patient & VM box is full 

## 2021-12-30 ENCOUNTER — Other Ambulatory Visit: Payer: Self-pay

## 2021-12-30 DIAGNOSIS — B20 Human immunodeficiency virus [HIV] disease: Secondary | ICD-10-CM

## 2021-12-30 MED ORDER — BIKTARVY 50-200-25 MG PO TABS: 1.0000 | ORAL_TABLET | Freq: Every day | ORAL | 2 refills | Status: DC

## 2022-01-02 ENCOUNTER — Ambulatory Visit: Payer: Self-pay | Admitting: Infectious Disease

## 2022-04-01 ENCOUNTER — Other Ambulatory Visit: Payer: Self-pay

## 2022-04-01 DIAGNOSIS — B20 Human immunodeficiency virus [HIV] disease: Secondary | ICD-10-CM

## 2022-04-01 MED ORDER — BIKTARVY 50-200-25 MG PO TABS: 1.0000 | ORAL_TABLET | Freq: Every day | ORAL | 0 refills | Status: DC

## 2022-05-01 ENCOUNTER — Ambulatory Visit: Payer: Self-pay | Admitting: Infectious Disease

## 2022-05-01 ENCOUNTER — Telehealth: Payer: Self-pay

## 2022-05-01 NOTE — Telephone Encounter (Signed)
Called patient to see if he would be able to make it to today's appointment, no answer and voicemail full. Patient will need RPR treatment as he did not finish his previous series of Bicillin from 11/11/21.  Sandie Ano, RN

## 2022-05-08 ENCOUNTER — Other Ambulatory Visit: Payer: Self-pay

## 2022-05-08 DIAGNOSIS — B20 Human immunodeficiency virus [HIV] disease: Secondary | ICD-10-CM

## 2022-05-08 MED ORDER — BIKTARVY 50-200-25 MG PO TABS: 1.0000 | ORAL_TABLET | Freq: Every day | ORAL | 0 refills | Status: DC

## 2022-05-08 NOTE — Progress Notes (Signed)
Refill sent.

## 2022-06-26 ENCOUNTER — Other Ambulatory Visit: Payer: Self-pay

## 2022-06-26 DIAGNOSIS — B20 Human immunodeficiency virus [HIV] disease: Secondary | ICD-10-CM

## 2022-06-26 MED ORDER — BIKTARVY 50-200-25 MG PO TABS: 1.0000 | ORAL_TABLET | Freq: Every day | ORAL | 0 refills | Status: DC

## 2022-07-03 ENCOUNTER — Ambulatory Visit: Payer: Self-pay

## 2022-07-03 ENCOUNTER — Ambulatory Visit: Payer: Self-pay | Admitting: Internal Medicine

## 2022-07-16 ENCOUNTER — Ambulatory Visit: Payer: Self-pay

## 2022-07-16 ENCOUNTER — Ambulatory Visit: Payer: Self-pay | Admitting: Internal Medicine

## 2022-07-23 ENCOUNTER — Telehealth: Payer: Self-pay

## 2022-07-23 NOTE — Telephone Encounter (Signed)
Received refill request for patient's Biktarvy, patient missed 8/31 appointment and UMAP will expire 9/30. Called patient to schedule appointment, no answer and unable to leave message.   Beryle Flock, RN

## 2022-09-18 ENCOUNTER — Telehealth: Payer: Self-pay

## 2022-09-18 NOTE — Telephone Encounter (Signed)
Detectable Viral Load Intervention   Most recent VL:  Lab Results  Component Value Date   HIV1RNAQUANT 280,000 (H) 08/28/2021     Current ART regimen:   Appointment status: patient does not have future appointment scheduled   Called patient to discuss medication adherence and possible barriers to care.   Unable to reach patient via phone. Will send Mychart to see if he is engaged in care somewhere else and the importance of keeping scheduled appointments/medication adherence.    Valarie Cones, LPN

## 2022-10-03 ENCOUNTER — Telehealth: Payer: Self-pay

## 2022-10-03 NOTE — Telephone Encounter (Signed)
Called pharmacy to request alternate contact for patient. Pharmacy is currently closed. Staff voicemail for pharmacy staff to return office phone call and speak to triage nurse.  Referral to bridge counselor to assist with engaging patient back into care as patient has not been seen in our office for > 6 months, needs RW/ADAP renewal for medication assistance as it has currently expired.   Valarie Cones, LPN

## 2022-10-03 NOTE — Telephone Encounter (Signed)
Natalia Leatherwood from [facility] returned phone call with an additional contact phone number [phone])for the patient. I have updated the patient's chart with the phone number and also tried to contact the patient on this number and no answer or secured VM setup. Norman Piacentini T Pricilla Loveless

## 2023-07-08 ENCOUNTER — Other Ambulatory Visit: Payer: Self-pay

## 2023-07-08 ENCOUNTER — Emergency Department (HOSPITAL_BASED_OUTPATIENT_CLINIC_OR_DEPARTMENT_OTHER): Payer: Self-pay

## 2023-07-08 ENCOUNTER — Emergency Department (HOSPITAL_BASED_OUTPATIENT_CLINIC_OR_DEPARTMENT_OTHER)
Admission: EM | Admit: 2023-07-08 | Discharge: 2023-07-08 | Disposition: A | Discharge status: Home / Self Care | Payer: Self-pay | Attending: Emergency Medicine | Admitting: Emergency Medicine

## 2023-07-08 ENCOUNTER — Encounter (HOSPITAL_BASED_OUTPATIENT_CLINIC_OR_DEPARTMENT_OTHER): Payer: Self-pay | Admitting: Pediatrics

## 2023-07-08 DIAGNOSIS — Z21 Asymptomatic human immunodeficiency virus [HIV] infection status: Secondary | ICD-10-CM | POA: Insufficient documentation

## 2023-07-08 DIAGNOSIS — D72829 Elevated white blood cell count, unspecified: Secondary | ICD-10-CM | POA: Insufficient documentation

## 2023-07-08 DIAGNOSIS — R109 Unspecified abdominal pain: Secondary | ICD-10-CM

## 2023-07-08 DIAGNOSIS — K5900 Constipation, unspecified: Secondary | ICD-10-CM | POA: Insufficient documentation

## 2023-07-08 LAB — URINALYSIS, ROUTINE W REFLEX MICROSCOPIC
Bilirubin Urine: NEGATIVE
Glucose, UA: NEGATIVE mg/dL
Hgb urine dipstick: NEGATIVE
Ketones, ur: NEGATIVE mg/dL
Leukocytes,Ua: NEGATIVE
Nitrite: NEGATIVE
Protein, ur: NEGATIVE mg/dL
Specific Gravity, Urine: <=1.005 (ref 1.005–1.030)
pH: 6.5 (ref 5.0–8.0)

## 2023-07-08 LAB — COMPREHENSIVE METABOLIC PANEL
ALT: 171 U/L — ABNORMAL HIGH (ref 0–44)
AST: 115 U/L — ABNORMAL HIGH (ref 15–41)
Albumin: 3.6 g/dL (ref 3.5–5.0)
Alkaline Phosphatase: 98 U/L (ref 38–126)
Anion gap: 11 (ref 5–15)
BUN: 19 mg/dL (ref 6–20)
CO2: 23 mmol/L (ref 22–32)
Calcium: 8.2 mg/dL — ABNORMAL LOW (ref 8.9–10.3)
Chloride: 94 mmol/L — ABNORMAL LOW (ref 98–111)
Creatinine, Ser: 1.07 mg/dL (ref 0.61–1.24)
GFR, Estimated: >60 mL/min (ref >60)
Glucose, Bld: 160 mg/dL — ABNORMAL HIGH (ref 70–99)
Potassium: 4 mmol/L (ref 3.5–5.1)
Sodium: 128 mmol/L — ABNORMAL LOW (ref 135–145)
Total Bilirubin: 1.1 mg/dL (ref 0.3–1.2)
Total Protein: 7.9 g/dL (ref 6.5–8.1)

## 2023-07-08 LAB — CBC
HCT: 46.2 % (ref 39.0–52.0)
Hemoglobin: 15.6 g/dL (ref 13.0–17.0)
MCH: 29.9 pg (ref 26.0–34.0)
MCHC: 33.8 g/dL (ref 30.0–36.0)
MCV: 88.7 fL (ref 80.0–100.0)
Platelets: 190 10*3/uL (ref 150–400)
RBC: 5.21 MIL/uL (ref 4.22–5.81)
RDW: 13.1 % (ref 11.5–15.5)
WBC: 19.1 10*3/uL — ABNORMAL HIGH (ref 4.0–10.5)
nRBC: 0 % (ref 0.0–0.2)

## 2023-07-08 LAB — LIPASE, BLOOD: Lipase: 22 U/L (ref 11–51)

## 2023-07-08 MED ORDER — SODIUM CHLORIDE 0.9 % IV BOLUS
1000.0000 mL | Freq: Once | INTRAVENOUS | Status: AC
  Administered 2023-07-08: 1000 mL via INTRAVENOUS

## 2023-07-08 MED ORDER — CIPROFLOXACIN HCL 500 MG PO TABS: 500.0000 mg | ORAL_TABLET | Freq: Two times a day (BID) | ORAL | 0 refills | Status: AC

## 2023-07-08 MED ORDER — OXYCODONE-ACETAMINOPHEN 5-325 MG PO TABS
1.0000 | ORAL_TABLET | Freq: Four times a day (QID) | ORAL | 0 refills | Status: AC | PRN
Start: 2023-07-08 — End: 2023-07-13

## 2023-07-08 MED ORDER — IOHEXOL 300 MG/ML  SOLN
100.0000 mL | Freq: Once | INTRAMUSCULAR | Status: AC | PRN
  Administered 2023-07-08: 100 mL via INTRAVENOUS

## 2023-07-08 MED ORDER — NITROGLYCERIN 0.4 MG SL SUBL: 0.4000 mg | SUBLINGUAL_TABLET | Freq: Once | SUBLINGUAL | Status: DC

## 2023-07-08 MED ORDER — KETOROLAC TROMETHAMINE 30 MG/ML IJ SOLN
30.0000 mg | Freq: Once | INTRAMUSCULAR | Status: AC
  Administered 2023-07-08: 30 mg via INTRAVENOUS
  Filled 2023-07-08: qty 1

## 2023-07-08 MED ORDER — METRONIDAZOLE 500 MG PO TABS: 500.0000 mg | ORAL_TABLET | Freq: Two times a day (BID) | ORAL | 0 refills | Status: AC

## 2023-07-08 NOTE — Discharge Instructions (Addendum)
I have reached out to our transitions of care team.  They will be contacting you regarding setting up an appointment with a PCP and GI provider.  Please be on the look out for a call from them.  If they have not contacted you by tomorrow afternoon, please reach out to the GI provider below to make an appointment to follow up on your CT scan results.  Please try to get in within the next month.  You have been prescribed ciprofloxacin. Take this antibiotic 2 times a day for the next 10 days.  You have been prescribed metronidazole. Take this antibiotic 2 times a day for the next 10 days. Take the full course of your antibiotic even if you start feeling better. Antibiotics may cause you to have diarrhea.  You have been prescribed Percocet-this is a narcotic/controlled substance medication that has potential addicting qualities.  You may take 1 tablet every 8 hours as needed for severe pain that is not controlled by the ketorolac.  Do not drive or operate heavy machinery when taking this medicine as it can be sedating. Do not drink alcohol or take other sedating medications when taking this medicine for safety reasons.  Keep this out of reach of small children.  Please be aware this medicine has Tylenol in it (325 mg/tab) do not exceed the maximum dose of Tylenol in a day per over the counter recommendations should you decide to supplement with Tylenol over the counter.    Your CT results are as below: "IMPRESSION: No bowel obstruction. Scattered colonic stool. Atypical distribution of bowel with cecum along the left side.   There is some asymmetric wall thickening along the rectum with adjacent stranding and several borderline to mildly enlarged lymph nodes seen perirectal, iliac chain and retroperitoneum. An aggressive process is in the differential and recommend further evaluation"  Drink plenty of fluids at home and eat a fiber fillet diet (lots of fruits and vegetables). Please engage in daily  exercise as this also helps to maintain regular bowel movements.   Try to use the bathroom after eating a meal. Place your feet up on a small stepstool when trying to have a bowel movement.  You may take 1 scoops of Miralax in the morning and 1 in the evening to help with constipation until you have a full bowel movement. You may also take Ducolax (Bisacodyl) 2 tablets (10mg ) once daily in the morning. These are medications you can get over the counter at any drugstore.    Return to the ER if your symptoms worsen, you cannot pass gas or are not passing any stool, you have uncontrolled nausea or vomiting, any other new or concerning symptoms.

## 2023-07-08 NOTE — ED Provider Notes (Signed)
[facility] [facility] Provider Note   CSN: [id number] Arrival date & time: 07/08/23  1106     History  Chief Complaint  Patient presents with   Abdominal Pain    Dalton Rodriguez is a 42 y.o. male with history of HIV presenting with concern for right flank pain and constipation.  Reports 1 week of constipation and then took magnesium citrate yesterday which then caused him to start having some watery stools.  Has not had a complete normal consistency bowel movement.  Developed right flank pain yesterday.  Also feels like he is not emptying his bladder completely but is able to urinate.  Notes a fever to 100.7 at home and chills.  Denies any dysuria, hematuria, increased frequency.  Denies any abdominal pain, nausea, or vomiting.   Abdominal Pain Associated symptoms: no vomiting        Home Medications Prior to Admission medications   Medication Sig Start Date End Date Taking? Authorizing Provider  ciprofloxacin (CIPRO) 500 MG tablet Take 1 tablet (500 mg total) by mouth every 12 (twelve) hours for 10 days. 07/08/23 07/18/23 Yes Arabella Merles, PA-C  metroNIDAZOLE (FLAGYL) 500 MG tablet Take 1 tablet (500 mg total) by mouth 2 (two) times daily for 10 days. 07/08/23 07/18/23 Yes Arabella Merles, PA-C  oxyCODONE-acetaminophen (PERCOCET/ROXICET) 5-325 MG tablet Take 1 tablet by mouth every 6 (six) hours as needed for up to 5 days for severe pain. 07/08/23 07/13/23 Yes Arabella Merles, PA-C  bictegravir-emtricitabine-tenofovir AF (BIKTARVY) 50-200-25 MG TABS tablet Take 1 tablet by mouth daily. 06/26/22   Randall Hiss, MD  terbinafine (LAMISIL AT) 1 % cream Apply 1 application topically 2 (two) times daily. 11/11/21   Vu, Tonita Phoenix, MD      Allergies    Ibuprofen    Review of Systems   Review of Systems  Gastrointestinal:  Negative for abdominal pain and vomiting.  Genitourinary:        Right-sided flank pain    Physical Exam Updated Vital  Signs BP (!) 151/87   Pulse 94   Temp 98.4 F (36.9 C)   Resp 17   Ht 6' (1.829 m)   Wt 90.7 kg   SpO2 99%   BMI 27.12 kg/m  Physical Exam Vitals and nursing note reviewed.  Constitutional:      General: He is not in acute distress.    Appearance: He is well-developed.     Comments: Slightly uncomfortable appearing, covered in sheets in bed  HENT:     Head: Normocephalic and atraumatic.  Eyes:     Conjunctiva/sclera: Conjunctivae normal.  Cardiovascular:     Rate and Rhythm: Regular rhythm. Tachycardia present.     Heart sounds: No murmur heard. Pulmonary:     Effort: Pulmonary effort is normal. No respiratory distress.     Breath sounds: Normal breath sounds.  Abdominal:     Palpations: Abdomen is soft.     Tenderness: There is no abdominal tenderness.     Comments: No CVA tenderness bilaterally  Musculoskeletal:        General: No swelling.     Cervical back: Neck supple.  Skin:    General: Skin is warm and dry.     Capillary Refill: Capillary refill takes less than 2 seconds.  Neurological:     Mental Status: He is alert.  Psychiatric:        Mood and Affect: Mood normal.     ED Results / Procedures /  Treatments   Labs (all labs ordered are listed, but only abnormal results are displayed) Labs Reviewed  COMPREHENSIVE METABOLIC PANEL - Abnormal; Notable for the following components:      Result Value   Sodium 128 (*)    Chloride 94 (*)    Glucose, Bld 160 (*)    Calcium 8.2 (*)    AST 115 (*)    ALT 171 (*)    All other components within normal limits  CBC - Abnormal; Notable for the following components:   WBC 19.1 (*)    All other components within normal limits  LIPASE, BLOOD  URINALYSIS, ROUTINE W REFLEX MICROSCOPIC    EKG None  Radiology CT ABDOMEN PELVIS W CONTRAST  Result Date: 07/08/2023 CLINICAL DATA:  Right-sided abdominal pain and flank pain. Constipation for a week. EXAM: CT ABDOMEN AND PELVIS WITH CONTRAST TECHNIQUE: Multidetector CT  imaging of the abdomen and pelvis was performed using the standard protocol following bolus administration of intravenous contrast. RADIATION DOSE REDUCTION: This exam was performed according to the departmental dose-optimization program which includes automated exposure control, adjustment of the mA and/or kV according to patient size and/or use of iterative reconstruction technique. CONTRAST:  OMNIPAQUE IOHEXOL 300 MG/ML  SOLN COMPARISON:  None Available. FINDINGS: Lower chest: There is some linear opacity along bases likely scar or atelectasis. No pleural effusion. Hepatobiliary: No focal liver abnormality is seen. No gallstones, gallbladder wall thickening, or biliary dilatation. Patent portal vein. Pancreas: Unremarkable. No pancreatic ductal dilatation or surrounding inflammatory changes. Spleen: Normal in size without focal abnormality. Adrenals/Urinary Tract: Adrenal glands are preserved. No enhancing renal mass or collecting system dilatation. There is some mild areas of atrophy along the kidneys. The ureters have normal course and caliber extending down to the bladder. Preserved contours of the urinary bladder. Stomach/Bowel: On this non oral contrast exam the stomach has a normal course and caliber. Small bowel overall has a normal course and caliber. There are some fluid-filled loops of small bowel in the right hemiabdomen. Large bowel has some scattered colonic stool. Some atypical distribution of colon with the cecum extending into left mid abdomen. Normal appendix is not clearly seen but no pericecal stranding or fluid. There is a redundant course of the upper to the sigmoid colon extending into the mid abdomen. The rectum is relatively decompressed but there is some slight asymmetric wall thickening with the adjacent stranding. In addition there are some abnormal adjacent lymph nodes. Vascular/Lymphatic: Abnormal lymph nodes seen in the pelvis, perirectal include example measuring 14 by 14 mm  on series 301, image 76 of the level of the upper sacrum. Several smaller nodes extending more caudal along the perirectal space more on the left than right. There are several nodes as well more proximal along the retroperitoneum. Example left para-aortic on series 301, image 42 measures 19 x 14 mm. Additional nodes along the right common iliac chain and external iliac chains. Normal caliber aorta and IVC. Reproductive: Prostate is unremarkable. Other: No free intra-abdominal air. Small fat containing umbilical hernia. No rim enhancing fluid collections. Musculoskeletal: No acute or significant osseous findings. IMPRESSION: No bowel obstruction. Scattered colonic stool. Atypical distribution of bowel with cecum along the left side. There is some asymmetric wall thickening along the rectum with adjacent stranding and several borderline to mildly enlarged lymph nodes seen perirectal, iliac chain and retroperitoneum. An aggressive process is in the differential and recommend further evaluation. Electronically Signed   By: Karen Kays M.D.   On:  07/08/2023 13:36    Procedures Procedures    Medications Ordered in ED Medications  ketorolac (TORADOL) 30 MG/ML injection 30 mg (30 mg Intravenous Given 07/08/23 1137)  iohexol (OMNIPAQUE) 300 MG/ML solution 100 mL (100 mLs Intravenous Contrast Given 07/08/23 1248)  sodium chloride 0.9 % bolus 1,000 mL (0 mLs Intravenous Stopped 07/08/23 1438)    ED Course/ Medical Decision Making/ A&P                                 Medical Decision Making Amount and/or Complexity of Data Reviewed Labs: ordered. Radiology: ordered.  Risk Prescription drug management.   42 y.o. male with pertinent past medical history of HIV presents to the ED for concern of constipation for 1 week and right flank pain that began yesterday  Differential diagnosis includes but is not limited to functional constipation, overflow diarrhea, bowel obstruction, nephrolithiasis, pyelonephritis,  pancreatitis  ED Course:  Patient reports 1 week of constipation and now with some watery bowel movements after taking magnesium citrate yesterday.  Denies any full bowel movements, could consider functional constipation or overflow diarrhea due to the symptoms.  He also notes right lower flank pain that started yesterday.  Notes a fever of 100.7 at home yesterday and some chills.  Today, no fever noted and no tachycardia.  He does not have any CVA tenderness bilaterally.  Denies any urinary symptoms, lower concern for UTI or pyelonephritis given urinalysis without any leukocytes or nitrites.   Given the reported right flank pain and subjective fever and chills, leukocytosis to 19.1, obtained CT abdomen pelvis for further evaluation.  This revealed asymmetric wall thickening along the rectum with stranding, also with some enlarged lymph nodes.  They noted that an "aggressive process" is in the differential and recommended further evaluation.  I discussed that there is a possible for malignancy that cannot ruled out.   He states he does not have any PCP or GI provider for follow-up.  I have placed a consult to [facility] for them to coordinate this care for the patient as he will need close follow-up, especially given immunocompromised status with HIV.  Given the inflammation noted on the CT scan, started patient on a course of metronidazole and ciprofloxacin for treatment of possible colitis.  Will treat pain at home with short course of Percocet, as any NSAIDs are contraindicated with his Biktarvy. Patient was found to have a hyponatremia of 128, he was given 1 L normal saline bolus Pain treated with IV ketorolac, reevaluation patient reports pain has resolved. I discussed this patient with Dr. Dalene Seltzer who feels patient can go home given pain has been controlled and follow-up closely with GI and PCP guarding his CT results.  Treat with course of antibiotics.   Impression: Right flank pain Asymmetric wall  thickening and stranding along the rectum on CT, concerning for possible colitis or malignancy  Disposition:  The patient was discharged home with instructions to be on the look out for a call from [facility] to coordinate PCP and GI follow-up.  I also provided patient with GI information and instructed him to contact their office for follow-up within the next week if he has not heard from [facility] by tomorrow.  Prescribed Percocet for pain.  Encouraged use of MiraLAX to help with bowel movements. Return precautions given.  Lab Tests: I Ordered, and personally interpreted labs.  The pertinent results include:   CMP with hyponatremia at 128,  hypochloremia at 94, calcium low at 8.2, AST elevated at 115, ALT elevated at 171 CBC with leukocytosis of 19.1 Lipase within normal limits Urinalysis unremarkable  Imaging Studies ordered: I ordered imaging studies including bladder scan, CT abdomen pelvis I independently visualized the imaging with scope of interpretation limited to determining acute life threatening conditions related to emergency care. Bladder scan showed 33 mL of urine CT abdomen pelvis shows asymmetric wall thickening and stranding along the rectum,  enlarged lymph nodes I agree with the radiologist interpretation   Co morbidities that complicate the patient evaluation  HIV  Social Determinants of Health:  impaired access to primary care             Final Clinical Impression(s) / ED Diagnoses Final diagnoses:  Right flank pain  Constipation, unspecified constipation type    Rx / DC Orders ED Discharge Orders          Ordered    metroNIDAZOLE (FLAGYL) 500 MG tablet  2 times daily        07/08/23 1459    ciprofloxacin (CIPRO) 500 MG tablet  Every 12 hours        07/08/23 1459    oxyCODONE-acetaminophen (PERCOCET/ROXICET) 5-325 MG tablet  Every 6 hours PRN        07/08/23 1459              Arabella Merles, PA-C 07/08/23 1638    Alvira Monday,  MD 07/10/23 [phone]

## 2023-07-08 NOTE — ED Notes (Signed)
Bladder scan was 33ml.

## 2023-07-08 NOTE — ED Triage Notes (Signed)
C/O right sided abdominal and flank pain started after taking magnesium citrate for a constipation x 1 week;

## 2023-09-07 ENCOUNTER — Telehealth: Payer: Self-pay

## 2023-09-07 ENCOUNTER — Other Ambulatory Visit (HOSPITAL_COMMUNITY): Payer: Self-pay

## 2023-09-07 NOTE — Telephone Encounter (Signed)
[facility] Patient Advocate Encounter  Insurance verification completed.    The patient is uninsured and will need patient assistance for medication.  We can complete the application and will need to meet with the patient for signatures and income documentation.

## 2023-09-08 ENCOUNTER — Other Ambulatory Visit: Payer: Self-pay

## 2023-09-08 ENCOUNTER — Encounter: Payer: Self-pay | Admitting: Internal Medicine

## 2023-09-08 ENCOUNTER — Ambulatory Visit: Payer: Self-pay

## 2023-09-08 ENCOUNTER — Ambulatory Visit (INDEPENDENT_AMBULATORY_CARE_PROVIDER_SITE_OTHER): Payer: Self-pay | Admitting: Internal Medicine

## 2023-09-08 VITALS — BP 132/90 | HR 94 | Temp 98.7°F | Resp 16 | Wt 212.6 lb

## 2023-09-08 DIAGNOSIS — A539 Syphilis, unspecified: Secondary | ICD-10-CM

## 2023-09-08 DIAGNOSIS — Z129 Encounter for screening for malignant neoplasm, site unspecified: Secondary | ICD-10-CM

## 2023-09-08 DIAGNOSIS — B182 Chronic viral hepatitis C: Secondary | ICD-10-CM

## 2023-09-08 DIAGNOSIS — Z1159 Encounter for screening for other viral diseases: Secondary | ICD-10-CM

## 2023-09-08 DIAGNOSIS — B20 Human immunodeficiency virus [HIV] disease: Secondary | ICD-10-CM

## 2023-09-08 DIAGNOSIS — Z113 Encounter for screening for infections with a predominantly sexual mode of transmission: Secondary | ICD-10-CM

## 2023-09-08 MED ORDER — PENICILLIN G BENZATHINE 1200000 UNIT/2ML IM SUSY
2.4000 10*6.[IU] | PREFILLED_SYRINGE | Freq: Once | INTRAMUSCULAR | Status: AC
Start: 2023-09-08 — End: 2023-09-08
  Administered 2023-09-08: 2.4 10*6.[IU] via INTRAMUSCULAR

## 2023-09-08 MED ORDER — BICTEGRAVIR-EMTRICITAB-TENOFOV 50-200-25 MG PO TABS: 1.0000 | ORAL_TABLET | Freq: Every day | ORAL | 11 refills | Status: DC

## 2023-09-08 MED ORDER — DOXYCYCLINE HYCLATE 100 MG PO TABS: 100.0000 mg | ORAL_TABLET | Freq: Two times a day (BID) | ORAL | 11 refills | Status: AC

## 2023-09-08 NOTE — Progress Notes (Signed)
[facility]  Patient Active Problem List   Diagnosis Date Noted   Grieving 11/23/2020   Moderate episode of recurrent major depressive disorder (HCC) 11/23/2020   Acute osteomyelitis of left clavicle (HCC) 03/30/2019   Septic arthritis of left acromioclavicular joint (HCC) 03/30/2019   Staphylococcal arthritis of right knee (HCC) 03/30/2019   Acute septic pulmonary embolism without acute cor pulmonale (HCC) 03/15/2019   Moderate tricuspid regurgitation 03/15/2019   SBE (subacute bacterial endocarditis) 03/15/2019   Severe mitral regurgitation 03/15/2019   Acute hypoxemic respiratory failure (HCC) 03/14/2019   Acute renal failure with tubular necrosis (HCC) 03/14/2019   Elevated d-dimer 03/14/2019   Hypocalcemia 03/14/2019   IVDU (intravenous drug user) 03/14/2019   Hyponatremia 03/14/2019   Sepsis due to methicillin susceptible Staphylococcus aureus (MSSA) with acute renal failure, tubular necrosis, and septic shock (HCC) 03/14/2019   Diarrhea 03/14/2019   Hypokalemia 03/14/2019   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   MRN: [id number]  Chief Complaint  Patient presents with   New Patient (Initial Visit)    Hep C, B20,  and positive RPR - was tested about 1 month ago.      HPI:  Dalton Rodriguez is a 42 y.o. 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 Last seen 11/2020 He has been taking biktarvy and reported compliance with no missed dose the last 4 weeks. However, I doubt this as he hasn't had virologic control since 2021 and lowest viral load is 200s with high in the log 4  Lab Results  Component Value Date   HIV1RNAQUANT 280,000 (H)  08/28/2021   Lab Results  Component Value Date   CD4TCELL 12 (L) 08/28/2021   CD4TABS 489 08/28/2021     He has hx ophthalmic syphlis and had 2 weeks pcn distant past. In 11/2020 complained of rash in the arms that failed to respond to steroid topical, given doxy as he didn't want im pcn at that time, in setting rising (only 2 folds though) rpr titer   He was seen in urgent care 10/10/21 [facility] for an acute days of itch rash legs/neck/arms given topical steroid/12 days of oral prednisone and shot steroid. No testing was checked at that time include syphilis  However in 08/28/2021 he had an rpr titer of 1:128 (routine f/u testing after he finished 4 weeks doxy 11/2020. He was treated more than a year before the doxycycline course.    The current rash has been there since around thanks giving. No improvement but worse. Round flaky itchy rash on arms/legs/groin and face. No headache/visual sx/hearing loss. His sexual contact also had ringworm -- patient's rash came after contact with his friend.  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 He reported he was also tested for syphilis at the rehab and was told he needed treatment but wanted to wait till this visit. His last treatment was more than a year before this  He last used iv drug 75 days prior to this visit  He has been taking biktarvy the past 30 days  He would like to have all std screening and anal pap     Allergies   Allergen Reactions   Ibuprofen     Specifically "Advil" the coating causes some swelling      Outpatient Medications Prior to Visit  Medication Sig Dispense Refill   bictegravir-emtricitabine-tenofovir AF (BIKTARVY) 50-200-25 MG TABS tablet Take 1 tablet by mouth daily. 30 tablet 0   terbinafine (LAMISIL AT) 1 % cream Apply 1 application topically 2 (two) times daily. (Patient not taking: Reported on 09/08/2023) 30 g 0   No facility-administered medications prior to visit.     Social History   Socioeconomic History   Marital status: Married    Spouse name: Not on file   Number of children: Not on file   Years of education: Not on file   Highest education level: Not on file  Occupational History   Not on file  Tobacco Use   Smoking status: Never   Smokeless tobacco: Never  Substance and Sexual Activity   Alcohol use: Not Currently    Comment: 3-4 drinks a week   Drug use: No   Sexual activity: Yes    Partners: Male    Birth control/protection: Condom  Other Topics Concern   Not on file  Social History Narrative   Not on file   Social Determinants of Health   Financial Resource Strain: Low Risk  (10/10/2022)   Received from [facility], [facility]   Overall Financial Resource Strain (CARDIA)    Difficulty of Paying Living Expenses: Not hard at all  Food Insecurity: Not on File (07/19/2021)   Received from [place], [place]   Food Insecurity    Food: 0  Transportation Needs: No Transportation Needs (10/10/2022)   Received from [employer], [facility]   PRAPARE - Transportation    Lack of Transportation (Medical): No    Lack of Transportation (Non-Medical): No  Physical Activity: Not on File (07/19/2021)   Received from [place], [place]   Physical Activity    Physical Activity: 0  Stress: No Stress Concern Present (10/10/2022)   Received from [facility], [facility] of Occupational Health - Occupational Stress Questionnaire    Feeling  of Stress : Not at all  Social Connections: Unknown (10/10/2022)   Received from [facility], [facility]   Social Network    Social Network: Not on file  Intimate Partner Violence: Unknown (10/10/2022)   Received from [facility], [facility]   HITS    Physically Hurt: Not on file    Insult or Talk Down To: Not on file    Threaten Physical Harm: Not on file    Scream or Curse: Not on file      Review of Systems    All other ros negative   Objective:    BP (!) 132/90   Pulse 94   Temp 98.7 F (37.1 C) (Temporal)   Resp 16   Wt 212 lb 9.6 oz (96.4 kg)   SpO2 95%  BMI 28.83 kg/m  Nursing note and vital signs reviewed.  Physical Exam  General/constitutional: no distress, pleasant HEENT: Normocephalic, PER, Conj Clear, EOMI, Oropharynx clear Neck supple CV: rrr no mrg Lungs: clear to auscultation, normal respiratory effort Abd: Soft, Nontender Ext: no edema Skin: No Rash on trunk/body; facial acne type rash Neuro: nonfocal MSK: no peripheral joint swelling/tenderness/warmth; back spines nontender              Labs: Lab Results  Component Value Date   WBC 19.1 (H) 07/08/2023   HGB 15.6 07/08/2023   HCT 46.2 07/08/2023   MCV 88.7 07/08/2023   PLT 190 07/08/2023   Last metabolic panel Lab Results  Component Value Date   GLUCOSE 160 (H) 07/08/2023   NA 128 (L) 07/08/2023   K 4.0 07/08/2023   CL 94 (L) 07/08/2023   CO2 23 07/08/2023   BUN 19 07/08/2023   CREATININE 1.07 07/08/2023   EGFR 113 08/28/2021   CALCIUM 8.2 (L) 07/08/2023   PROT 7.9 07/08/2023   ALBUMIN 3.6 07/08/2023   BILITOT 1.1 07/08/2023   ALKPHOS 98 07/08/2023   AST 115 (H) 07/08/2023   ALT 171 (H) 07/08/2023   ANIONGAP 11 07/08/2023    Micro:  Serology:  Imaging:  Assessment & Plan:   Problem List Items Addressed This Visit       Other   HIV disease (HCC) - Primary   Other Visit Diagnoses     Chronic hepatitis C without hepatic coma (HCC)        Need for hepatitis B screening test       Screening for STDs (sexually transmitted diseases)       Syphilis           #hiv Noncompliant in past Inquires about cabenuva -- at this time not a good candidate but could change in future  Will review reprieve trial next visit  -discussed u=u -encourage compliance -continue current HIV medication biktarvy -aiming for 6 months virologic control before considering cabenuva -labs today -f/u in 3 months follow up -samples biktarvy 1 month prior to [employer] benefit kicks in   #hx syphilis Rising titer and ongoing unprotected sexual contact Last ophthalmic syphlis >1 year ago. Recurrent after that and last treated more than a year ago prior to 09/08/23  Recent rpr elevated again in rehab (don't have result here)  -will start late latent treatment today -repeat rpr today   #std screen  -Triple screen today. Assymptomatic   #hep c 2021 testing negative 07/2023 drug rehab testing positive   ?acute infection -- last ivdu 75 days ago before 09/08/23 id visit  -retest hep c rna today and will do again in 3 months before consider 8 week course mavyret -hep b screening    #hcm -vaccination Will review -hepatitis Hep b testing today Hep c ?acute infection --> recheck hep c rna -std screen See above -tb screen Will review -cancer Anal pap today 09/08/23   No orders of the defined types were placed in this encounter.       Follow-up: Return in about 3 months (around 12/09/2023).      Raymondo Band, MD [facility] [address] for [facility] [phone]  pager   [phone] cell 09/08/2023, 2:14 PM

## 2023-09-08 NOTE — Addendum Note (Signed)
Addended by: Juanita Laster on: 09/08/2023 04:40 PM   Modules accepted: Orders

## 2023-09-08 NOTE — Patient Instructions (Signed)
Will start late latent treatment for syphilis today (3 weekly injections) First penicillin shot today Set up another 1 and then 2 week nurse visit to finish this    Continue biktarvy. Sample given. Rx sent to [facility] on [address] and can pick up when ryan white benefit kicks in   Doxy pep sent to [address] too   All labs today   Anal cancer screening today   See me in 3 months

## 2023-09-09 ENCOUNTER — Other Ambulatory Visit: Payer: Self-pay | Admitting: Pharmacist

## 2023-09-09 DIAGNOSIS — B20 Human immunodeficiency virus [HIV] disease: Secondary | ICD-10-CM

## 2023-09-09 LAB — CYTOLOGY, (ORAL, ANAL, URETHRAL) ANCILLARY ONLY
Chlamydia: NEGATIVE
Chlamydia: NEGATIVE
Comment: NEGATIVE
Comment: NEGATIVE
Comment: NORMAL
Comment: NORMAL
Neisseria Gonorrhea: NEGATIVE
Neisseria Gonorrhea: NEGATIVE

## 2023-09-09 LAB — T-HELPER CELL (CD4) - (RCID CLINIC ONLY)
CD4 % Helper T Cell: 15 % — ABNORMAL LOW (ref 33–65)
CD4 T Cell Abs: 705 /uL (ref 400–1790)

## 2023-09-09 LAB — URINE CYTOLOGY ANCILLARY ONLY
Chlamydia: NEGATIVE
Comment: NEGATIVE
Comment: NORMAL
Neisseria Gonorrhea: NEGATIVE

## 2023-09-09 MED ORDER — BICTEGRAVIR-EMTRICITAB-TENOFOV 50-200-25 MG PO TABS
1.0000 | ORAL_TABLET | Freq: Every day | ORAL | Status: AC
Start: 2023-09-08 — End: 2023-10-06

## 2023-09-09 NOTE — Progress Notes (Signed)
Medication Samples have been provided to the patient.  Drug name: Biktarvy        Strength: 50/200/25 mg       Qty: 28 tablets (4 bottles) LOT: CSCFVA   Exp.Date: 10/26  Dosing instructions: Take one tablet by mouth once daily  The patient has been instructed regarding the correct time, dose, and frequency of taking this medication, including desired effects and most common side effects.   Margarite Gouge, PharmD, CPP, BCIDP, AAHIVP Clinical Pharmacist Practitioner Infectious Diseases Clinical Pharmacist [facility]

## 2023-09-11 LAB — HEPATITIS C RNA QUANTITATIVE
HCV Quantitative Log: 6.06 {Log_IU}/mL — ABNORMAL HIGH
HCV RNA, PCR, QN: 1140000 [IU]/mL — ABNORMAL HIGH

## 2023-09-11 LAB — HEPATITIS B CORE ANTIBODY, TOTAL: Hep B Core Total Ab: NONREACTIVE

## 2023-09-11 LAB — HEPATITIS C ANTIBODY: Hepatitis C Ab: REACTIVE — AB

## 2023-09-11 LAB — CBC
HCT: 48.3 % (ref 38.5–50.0)
Hemoglobin: 16.5 g/dL (ref 13.2–17.1)
MCH: 30.4 pg (ref 27.0–33.0)
MCHC: 34.2 g/dL (ref 32.0–36.0)
MCV: 89.1 fL (ref 80.0–100.0)
MPV: 10 fL (ref 7.5–12.5)
Platelets: 228 10*3/uL (ref 140–400)
RBC: 5.42 10*6/uL (ref 4.20–5.80)
RDW: 13.1 % (ref 11.0–15.0)
WBC: 9.2 10*3/uL (ref 3.8–10.8)

## 2023-09-11 LAB — RPR: RPR Ser Ql: REACTIVE — AB

## 2023-09-11 LAB — COMPLETE METABOLIC PANEL WITH GFR
AG Ratio: 1.3 (calc) (ref 1.0–2.5)
ALT: 306 U/L — ABNORMAL HIGH (ref 9–46)
AST: 239 U/L — ABNORMAL HIGH (ref 10–40)
Albumin: 4.3 g/dL (ref 3.6–5.1)
Alkaline phosphatase (APISO): 101 U/L (ref 36–130)
BUN: 14 mg/dL (ref 7–25)
CO2: 29 mmol/L (ref 20–32)
Calcium: 9.5 mg/dL (ref 8.6–10.3)
Chloride: 102 mmol/L (ref 98–110)
Creat: 0.9 mg/dL (ref 0.60–1.29)
Globulin: 3.3 g/dL (ref 1.9–3.7)
Glucose, Bld: 86 mg/dL (ref 65–99)
Potassium: 4.2 mmol/L (ref 3.5–5.3)
Sodium: 137 mmol/L (ref 135–146)
Total Bilirubin: 0.5 mg/dL (ref 0.2–1.2)
Total Protein: 7.6 g/dL (ref 6.1–8.1)
eGFR: 111 mL/min/{1.73_m2} (ref >=60)

## 2023-09-11 LAB — HEPATITIS B SURFACE ANTIGEN: Hepatitis B Surface Ag: NONREACTIVE

## 2023-09-11 LAB — T PALLIDUM AB: T Pallidum Abs: POSITIVE — AB

## 2023-09-11 LAB — HIV-1 RNA ULTRAQUANT REFLEX TO GENTYP+
HIV 1 RNA Quant: 42 {copies}/mL — ABNORMAL HIGH
HIV-1 RNA Quant, Log: 1.62 {Log} — ABNORMAL HIGH

## 2023-09-11 LAB — RPR TITER: RPR Titer: 1:32 {titer} — ABNORMAL HIGH

## 2023-09-14 LAB — CYTOLOGY - PAP
Adequacy: ABSENT
Comment: NEGATIVE
Diagnosis: UNDETERMINED — AB
High risk HPV: NEGATIVE

## 2023-09-15 ENCOUNTER — Ambulatory Visit (INDEPENDENT_AMBULATORY_CARE_PROVIDER_SITE_OTHER): Payer: Self-pay

## 2023-09-15 ENCOUNTER — Other Ambulatory Visit: Payer: Self-pay

## 2023-09-15 DIAGNOSIS — A539 Syphilis, unspecified: Secondary | ICD-10-CM

## 2023-09-15 MED ORDER — PENICILLIN G BENZATHINE 1200000 UNIT/2ML IM SUSY
2.4000 10*6.[IU] | PREFILLED_SYRINGE | Freq: Once | INTRAMUSCULAR | Status: AC
Start: 2023-09-15 — End: 2023-09-15
  Administered 2023-09-15: 2.4 10*6.[IU] via INTRAMUSCULAR

## 2023-09-22 ENCOUNTER — Other Ambulatory Visit: Payer: Self-pay

## 2023-09-22 ENCOUNTER — Ambulatory Visit (INDEPENDENT_AMBULATORY_CARE_PROVIDER_SITE_OTHER): Payer: Self-pay

## 2023-09-22 DIAGNOSIS — A539 Syphilis, unspecified: Secondary | ICD-10-CM

## 2023-09-22 MED ORDER — PENICILLIN G BENZATHINE 1200000 UNIT/2ML IM SUSY
2.4000 10*6.[IU] | PREFILLED_SYRINGE | Freq: Once | INTRAMUSCULAR | Status: AC
Start: 2023-09-22 — End: 2023-09-22
  Administered 2023-09-22: 2.4 10*6.[IU] via INTRAMUSCULAR

## 2023-12-10 ENCOUNTER — Ambulatory Visit: Payer: Self-pay | Admitting: Internal Medicine

## 2024-03-14 NOTE — Progress Notes (Signed)
 The ASCVD Risk score (Arnett DK, et al., 2019) failed to calculate for the following reasons:   Cannot find a previous HDL lab   Cannot find a previous total cholesterol lab  Arlon Bergamo, BSN, RN

## 2024-08-02 ENCOUNTER — Telehealth: Payer: Self-pay

## 2024-08-02 NOTE — Telephone Encounter (Signed)
 Called patient and left message asking if I could schedule an appointmen.

## 2024-10-02 ENCOUNTER — Other Ambulatory Visit: Payer: Self-pay | Admitting: Internal Medicine

## 2024-10-05 ENCOUNTER — Other Ambulatory Visit: Payer: Self-pay

## 2024-10-05 ENCOUNTER — Other Ambulatory Visit (HOSPITAL_COMMUNITY)
Admission: RE | Admit: 2024-10-05 | Discharge: 2024-10-05 | Disposition: A | Discharge status: Home / Self Care | Source: Ambulatory Visit | Attending: Internal Medicine | Admitting: Internal Medicine

## 2024-10-05 ENCOUNTER — Encounter: Payer: Self-pay | Admitting: Internal Medicine

## 2024-10-05 ENCOUNTER — Ambulatory Visit: Payer: Self-pay | Admitting: Internal Medicine

## 2024-10-05 VITALS — BP 133/87 | HR 81 | Temp 98.3°F | Resp 16 | Wt 203.4 lb

## 2024-10-05 DIAGNOSIS — Z113 Encounter for screening for infections with a predominantly sexual mode of transmission: Secondary | ICD-10-CM

## 2024-10-05 DIAGNOSIS — B20 Human immunodeficiency virus [HIV] disease: Secondary | ICD-10-CM | POA: Diagnosis not present

## 2024-10-05 DIAGNOSIS — Z8619 Personal history of other infectious and parasitic diseases: Secondary | ICD-10-CM

## 2024-10-05 DIAGNOSIS — Z79899 Other long term (current) drug therapy: Secondary | ICD-10-CM | POA: Diagnosis not present

## 2024-10-05 DIAGNOSIS — Z7252 High risk homosexual behavior: Secondary | ICD-10-CM

## 2024-10-05 DIAGNOSIS — B182 Chronic viral hepatitis C: Secondary | ICD-10-CM

## 2024-10-05 MED ORDER — DOXYCYCLINE HYCLATE 100 MG PO TABS: ORAL_TABLET | ORAL | 2 refills | Status: AC

## 2024-10-05 MED ORDER — BICTEGRAVIR-EMTRICITAB-TENOFOV 50-200-25 MG PO TABS: 1.0000 | ORAL_TABLET | Freq: Every day | ORAL | 11 refills | Status: DC

## 2024-10-05 NOTE — Progress Notes (Signed)
 [facility]  Patient Active Problem List   Diagnosis Date Noted   Grieving 11/23/2020   Moderate episode of recurrent major depressive disorder (HCC) 11/23/2020   Acute osteomyelitis of left clavicle (HCC) 03/30/2019   Septic arthritis of left acromioclavicular joint (HCC) 03/30/2019   Staphylococcal arthritis of right knee (HCC) 03/30/2019   Acute septic pulmonary embolism without acute cor pulmonale (HCC) 03/15/2019   Moderate tricuspid regurgitation 03/15/2019   SBE (subacute bacterial endocarditis) 03/15/2019   Severe mitral regurgitation 03/15/2019   Acute hypoxemic respiratory failure (HCC) 03/14/2019   Acute renal failure with tubular necrosis 03/14/2019   Elevated d-dimer 03/14/2019   Hypocalcemia 03/14/2019   IVDU (intravenous drug user) 03/14/2019   Hyponatremia 03/14/2019   Sepsis due to methicillin susceptible Staphylococcus aureus (MSSA) with acute renal failure, tubular necrosis, and septic shock (HCC) 03/14/2019   Diarrhea 03/14/2019   Hypokalemia 03/14/2019   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   MRN: [id number]  Chief Complaint  Patient presents with   Follow-up    B20 - patient reports he has been out of medication.     HPI:  Dalton Rodriguez is a 43 y.o. 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 Last seen 11/2020 He has been taking biktarvy  and reported compliance with no missed dose the last 4 weeks. However, I doubt this as he hasn't had virologic control since 2021 and lowest viral load is 200s with high in the log 4  Lab Results  Component Value Date   HIV1RNAQUANT 42 (H) 09/08/2023   Lab Results  Component  Value Date   CD4TCELL 15 (L) 09/08/2023   CD4TABS 705 09/08/2023     He has hx ophthalmic syphlis and had 2 weeks pcn distant past. In 11/2020 complained of rash in the arms that failed to respond to steroid topical, given doxy as he didn't want im pcn at that time, in setting rising (only 2 folds though) rpr titer   He was seen in urgent care 10/10/21 [facility] for an acute days of itch rash legs/neck/arms given topical steroid/12 days of oral prednisone and shot steroid. No testing was checked at that time include syphilis  However in 08/28/2021 he had an rpr titer of 1:128 (routine f/u testing after he finished 4 weeks doxy 11/2020. He was treated more than a year before the doxycycline  course.    The current rash has been there since around thanks giving. No improvement but worse. Round flaky itchy rash on arms/legs/groin and face. No headache/visual sx/hearing loss. His sexual contact also had ringworm -- patient's rash came after contact with his friend.  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 He reported he was also tested for syphilis at the rehab and was told he needed treatment but wanted to wait till this visit. His last treatment was more than a year before this  He last used iv drug 75 days prior to this visit  He has been taking biktarvy  the past 30 days  He would like to have all std screening and anal pap  10/05/24 id clinic f/u See below Recent meth iv relapse and  off biktarvy  Needs to see finance today   Allergies  Allergen Reactions   Ibuprofen      Specifically Advil  the coating causes some swelling      Outpatient Medications Prior to Visit  Medication Sig Dispense Refill   bictegravir-emtricitabine -tenofovir  AF (BIKTARVY ) 50-200-25 MG TABS tablet Take 1 tablet by mouth daily. (Patient not taking: Reported on 10/05/2024) 30 tablet 11   terbinafine  (LAMISIL  AT) 1 % cream Apply 1 application topically 2 (two) times daily. (Patient not taking: Reported on 10/05/2024) 30 g 0   No facility-administered medications prior to visit.     Social History   Socioeconomic History   Marital status: Married    Spouse name: Not on file   Number of children: Not on file   Years of education: Not on file   Highest education level: Not on file  Occupational History   Not on file  Tobacco Use   Smoking status: Never   Smokeless tobacco: Never  Substance and Sexual Activity   Alcohol use: Not Currently    Comment: 3-4 drinks a week   Drug use: No   Sexual activity: Yes    Partners: Male    Birth control/protection: Condom  Other Topics Concern   Not on file  Social History Narrative   Not on file   Social Drivers of Health   Financial Resource Strain: Low Risk  (10/10/2022)   Received from [facility]   Overall Financial Resource Strain (CARDIA)    Difficulty of Paying Living Expenses: Not hard at all  Food Insecurity: Not on File (07/19/2021)   Received from [facility] Insecurity    Food: 0  Transportation Needs: No Transportation Needs (10/10/2022)   Received from [facility] - Transportation    Lack of Transportation (Medical): No    Lack of Transportation (Non-Medical): No  Physical Activity: Not on File (07/19/2021)   Received from [facility]   Physical Activity    Physical Activity: 0  Stress: No Stress Concern Present (10/10/2022)   Received from [facility] -  Occupational Stress Questionnaire    Feeling of Stress : Not at all  Social Connections: Unknown (10/10/2022)   Received from [facility]   Social Network    Social Network: Not on file  Intimate Partner Violence: Unknown (10/10/2022)   Received from [facility]   HITS    Physically Hurt: Not on file    Insult or Talk Down To: Not on file    Threaten Physical Harm: Not on file    Scream or Curse: Not on file      Review of Systems    All other ros negative   Objective:    Wt 203 lb 6.4 oz (92.3 kg)   BMI 27.59 kg/m  Nursing note and vital signs reviewed.  Physical Exam  General/constitutional: no distress, pleasant HEENT: Normocephalic, PER, Conj Clear,  EOMI, Oropharynx clear Neck supple CV: rrr no mrg Lungs: clear to auscultation, normal respiratory effort Abd: Soft, Nontender Ext: no edema Skin: No Rash on trunk/body; facial acne type rash Neuro: nonfocal MSK: no peripheral joint swelling/tenderness/warmth; back spines nontender              Labs: Lab Results  Component Value Date   WBC 9.2 09/08/2023   HGB 16.5 09/08/2023   HCT 48.3 09/08/2023   MCV 89.1 09/08/2023   PLT 228 09/08/2023   Last metabolic panel Lab Results  Component Value Date   GLUCOSE 86 09/08/2023   NA 137 09/08/2023   K 4.2 09/08/2023   CL 102 09/08/2023   CO2 29 09/08/2023   BUN 14 09/08/2023   CREATININE 0.90 09/08/2023   EGFR 111 09/08/2023   CALCIUM 9.5 09/08/2023   PROT 7.6 09/08/2023   ALBUMIN 3.6 07/08/2023   BILITOT 0.5 09/08/2023   ALKPHOS 98 07/08/2023   AST 239 (H) 09/08/2023   ALT 306 (H) 09/08/2023   ANIONGAP 11 07/08/2023    Micro:  Serology:  Imaging:  Assessment & Plan:   Problem List Items Addressed This Visit   None    #hiv Noncompliant in past Inquires about cabenuva -- at this time not a good candidate but could change in future  10/05/24 ran out of biktarvy  for the past month... when he was taking it he was fairly good on  daily use. 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 Recurrent after that and last treated more than a year ago prior to 09/08/23  Recent rpr elevated again in rehab (don't have result here) treated as late latent with 3 shots bicillin  by 09/2023  -rpr today   #high risk sexual behavior #std screen  -Triple screen 10/05/24 -refill doxy pep   #hep c #ivdu 2021 testing negative 07/2023 drug rehab testing positive   ?acute infection -- last ivdu 75 days ago before 09/08/23 id visit 09/2023 viral load 1.1 million unit  -relapsed iv meth 09/2024 -retest hep c rna today; if chronic hep c remains will do 8 week course mavyret    #hcm -vaccination Tdap 2022 Prevnar 20 today 10/05/24 -hepatitis 2021 hep b sAb positive -std screen See above -tb screen 10/05/24 quantiferon gold testing ordered -cancer Anal pap today 09/08/23 -- ascus but negative hpv; will repeat in 3 months 01/2025   No orders of the defined types were placed in this encounter.       Follow-up: No follow-ups on file.      Constance ONEIDA Passer, MD [facility] [phone]  pager   [phone] cell 10/05/2024, 10:30 AM

## 2024-10-05 NOTE — Patient Instructions (Signed)
 Doxy/biktarvy  renewed   Pneumonia vaccine today    Labs/urine-swabs today   Repeat hep c testing and will plan treatment soon

## 2024-10-06 LAB — CYTOLOGY, (ORAL, ANAL, URETHRAL) ANCILLARY ONLY
Chlamydia: NEGATIVE
Chlamydia: POSITIVE — AB
Comment: NEGATIVE
Comment: NEGATIVE
Comment: NORMAL
Comment: NORMAL
Neisseria Gonorrhea: NEGATIVE
Neisseria Gonorrhea: POSITIVE — AB

## 2024-10-06 LAB — T-HELPER CELLS (CD4) COUNT (NOT AT ARMC)
CD4 % Helper T Cell: 11 % — ABNORMAL LOW (ref 33–65)
CD4 T Cell Abs: 470 /uL (ref 400–1790)

## 2024-10-06 LAB — URINE CYTOLOGY ANCILLARY ONLY
Chlamydia: NEGATIVE
Comment: NEGATIVE
Comment: NEGATIVE
Comment: NORMAL
Neisseria Gonorrhea: NEGATIVE
Trichomonas: NEGATIVE

## 2024-10-07 ENCOUNTER — Telehealth: Payer: Self-pay

## 2024-10-07 DIAGNOSIS — A749 Chlamydial infection, unspecified: Secondary | ICD-10-CM

## 2024-10-07 NOTE — Telephone Encounter (Signed)
 Sounds good thanks for checking the labs

## 2024-10-07 NOTE — Telephone Encounter (Signed)
 Rectal swab positive for gonorrhea and chlamydia. Will route to provider.   Vannessa Godown, BSN, RN

## 2024-10-08 LAB — CBC
HCT: 46.1 % (ref 39.4–51.1)
Hemoglobin: 15.7 g/dL (ref 13.2–17.1)
MCH: 29.8 pg (ref 27.0–33.0)
MCHC: 34.1 g/dL (ref 31.6–35.4)
MCV: 87.6 fL (ref 81.4–101.7)
MPV: 10 fL (ref 7.5–12.5)
Platelets: 187 Thousand/uL (ref 140–400)
RBC: 5.26 Million/uL (ref 4.20–5.80)
RDW: 13.1 % (ref 11.0–15.0)
WBC: 9 Thousand/uL (ref 3.8–10.8)

## 2024-10-08 LAB — HEPATITIS C RNA QUANTITATIVE
HCV Quantitative Log: 7.36 {Log_IU}/mL — ABNORMAL HIGH
HCV RNA, PCR, QN: 22900000 [IU]/mL — ABNORMAL HIGH

## 2024-10-08 LAB — COMPLETE METABOLIC PANEL WITHOUT GFR
AG Ratio: 0.9 (calc) — ABNORMAL LOW (ref 1.0–2.5)
ALT: 135 U/L — ABNORMAL HIGH (ref 9–46)
AST: 191 U/L — ABNORMAL HIGH (ref 10–40)
Albumin: 4.1 g/dL (ref 3.6–5.1)
Alkaline phosphatase (APISO): 131 U/L — ABNORMAL HIGH (ref 36–130)
BUN: 15 mg/dL (ref 7–25)
CO2: 29 mmol/L (ref 20–32)
Calcium: 9.4 mg/dL (ref 8.6–10.3)
Chloride: 101 mmol/L (ref 98–110)
Creat: 0.96 mg/dL (ref 0.60–1.29)
Globulin: 4.5 g/dL — ABNORMAL HIGH (ref 1.9–3.7)
Glucose, Bld: 87 mg/dL (ref 65–99)
Potassium: 4.3 mmol/L (ref 3.5–5.3)
Sodium: 138 mmol/L (ref 135–146)
Total Bilirubin: 0.8 mg/dL (ref 0.2–1.2)
Total Protein: 8.6 g/dL — ABNORMAL HIGH (ref 6.1–8.1)

## 2024-10-08 LAB — HEPATITIS C GENOTYPE: HCV Genotype: 3

## 2024-10-08 LAB — SYPHILIS: RPR W/REFLEX TO RPR TITER AND TREPONEMAL ANTIBODIES, TRADITIONAL SCREENING AND DIAGNOSIS ALGORITHM: RPR Ser Ql: REACTIVE — AB

## 2024-10-08 LAB — HIV-1 RNA QUANT-NO REFLEX-BLD
HIV 1 RNA Quant: 162000 {copies}/mL — ABNORMAL HIGH
HIV-1 RNA Quant, Log: 5.21 {Log_copies}/mL — ABNORMAL HIGH

## 2024-10-08 LAB — RPR TITER: RPR Titer: 1:16 {titer} — ABNORMAL HIGH

## 2024-10-08 LAB — T PALLIDUM AB: T Pallidum Abs: POSITIVE — AB

## 2024-10-10 ENCOUNTER — Ambulatory Visit (INDEPENDENT_AMBULATORY_CARE_PROVIDER_SITE_OTHER)

## 2024-10-10 ENCOUNTER — Other Ambulatory Visit: Payer: Self-pay

## 2024-10-10 DIAGNOSIS — A549 Gonococcal infection, unspecified: Secondary | ICD-10-CM | POA: Diagnosis not present

## 2024-10-10 MED ORDER — CEFTRIAXONE SODIUM 500 MG IJ SOLR
500.0000 mg | Freq: Once | INTRAMUSCULAR | Status: AC
  Administered 2024-10-10: 500 mg via INTRAMUSCULAR

## 2024-10-10 MED ORDER — DOXYCYCLINE HYCLATE 100 MG PO TABS: 100.0000 mg | ORAL_TABLET | Freq: Two times a day (BID) | ORAL | 0 refills | Status: AC

## 2024-10-10 NOTE — Telephone Encounter (Signed)
 Spoke with Dalton Rodriguez, relayed positive results for gonorrhea and chlamydia. Reviewed treatment plan with him. He will come in this morning for CTX. Doxy sent to pharmacy. Advised patient no sex until treatment is completed plus an additional 7 days. Patient verbalized understanding and has no further questions.   Erwin Nishiyama, BSN, RN

## 2024-10-10 NOTE — Addendum Note (Signed)
 Addended by: FLORENE BOUCHARD D on: 10/10/2024 08:59 AM   Modules accepted: Orders

## 2024-10-14 ENCOUNTER — Ambulatory Visit: Payer: Self-pay | Admitting: Internal Medicine

## 2024-11-22 ENCOUNTER — Ambulatory Visit: Payer: Self-pay | Admitting: Internal Medicine

## 2024-11-22 ENCOUNTER — Other Ambulatory Visit (HOSPITAL_COMMUNITY)
Admission: RE | Admit: 2024-11-22 | Discharge: 2024-11-22 | Disposition: A | Discharge status: Home / Self Care | Source: Ambulatory Visit | Attending: Internal Medicine | Admitting: Internal Medicine

## 2024-11-22 ENCOUNTER — Other Ambulatory Visit (HOSPITAL_COMMUNITY): Payer: Self-pay

## 2024-11-22 ENCOUNTER — Other Ambulatory Visit: Payer: Self-pay

## 2024-11-22 VITALS — BP 154/89 | HR 81 | Temp 98.0°F | Ht 72.0 in | Wt 207.6 lb

## 2024-11-22 DIAGNOSIS — Z23 Encounter for immunization: Secondary | ICD-10-CM | POA: Diagnosis not present

## 2024-11-22 DIAGNOSIS — Z91198 Patient's noncompliance with other medical treatment and regimen for other reason: Secondary | ICD-10-CM

## 2024-11-22 DIAGNOSIS — Z8619 Personal history of other infectious and parasitic diseases: Secondary | ICD-10-CM

## 2024-11-22 DIAGNOSIS — B20 Human immunodeficiency virus [HIV] disease: Secondary | ICD-10-CM | POA: Diagnosis not present

## 2024-11-22 DIAGNOSIS — B182 Chronic viral hepatitis C: Secondary | ICD-10-CM | POA: Diagnosis not present

## 2024-11-22 DIAGNOSIS — Z113 Encounter for screening for infections with a predominantly sexual mode of transmission: Secondary | ICD-10-CM | POA: Insufficient documentation

## 2024-11-22 DIAGNOSIS — A150 Tuberculosis of lung: Secondary | ICD-10-CM

## 2024-11-22 MED ORDER — BICTEGRAVIR-EMTRICITAB-TENOFOV 50-200-25 MG PO TABS: 1.0000 | ORAL_TABLET | Freq: Every day | ORAL | 11 refills | Status: AC

## 2024-11-22 NOTE — Patient Instructions (Signed)
 Continue biktarvy    Will do std screens again today   Please see me in 2 months for another hiv viral load testing   For your chronic hepatitis c, will refer you to our pharmacy team to start hep c treatment. It'll either be 8 weeks with mavyret or 12 weeks with epclusa. We'll recheck your hep c viral load no sooner than 3 months after you finish the hep c treatment, to test for cure

## 2024-11-22 NOTE — Progress Notes (Signed)
 "       [facility]  Patient Active Problem List   Diagnosis Date Noted   Grieving 11/23/2020   Moderate episode of recurrent major depressive disorder (HCC) 11/23/2020   Acute osteomyelitis of left clavicle (HCC) 03/30/2019   Septic arthritis of left acromioclavicular joint (HCC) 03/30/2019   Staphylococcal arthritis of right knee (HCC) 03/30/2019   Acute septic pulmonary embolism without acute cor pulmonale (HCC) 03/15/2019   Moderate tricuspid regurgitation 03/15/2019   SBE (subacute bacterial endocarditis) 03/15/2019   Severe mitral regurgitation 03/15/2019   Acute hypoxemic respiratory failure (HCC) 03/14/2019   Acute renal failure with tubular necrosis 03/14/2019   Elevated d-dimer 03/14/2019   Hypocalcemia 03/14/2019   IVDU (intravenous drug user) 03/14/2019   Hyponatremia 03/14/2019   Sepsis due to methicillin susceptible Staphylococcus aureus (MSSA) with acute renal failure, tubular necrosis, and septic shock (HCC) 03/14/2019   Diarrhea 03/14/2019   Hypokalemia 03/14/2019   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   MRN: [id number]  Chief Complaint  Patient presents with   Follow-up    B20 6 week f/u, STI testing says pneumonia vac was mentioned because he's had it a few times    HPI:  Dalton Rodriguez is a 44 y.o. 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 Last seen 11/2020 He has been taking biktarvy  and reported compliance with no missed dose the last 4 weeks. However, I doubt this as he hasn't had virologic control since 2021 and lowest viral load is 200s with high in the log 4   He has hx ophthalmic syphlis and had 2 weeks pcn distant  past. In 11/2020 complained of rash in the arms that failed to respond to steroid topical, given doxy as he didn't want im pcn at that time, in setting rising (only 2 folds though) rpr titer   He was seen in urgent care 10/10/21 [facility] for an acute days of itch rash legs/neck/arms given topical steroid/12 days of oral prednisone and shot steroid. No testing was checked at that time include syphilis  However in 08/28/2021 he had an rpr titer of 1:128 (routine f/u testing after he finished 4 weeks doxy 11/2020. He was treated more than a year before the doxycycline  course.    The current rash has been there since around thanks giving. No improvement but worse. Round flaky itchy rash on arms/legs/groin and face. No headache/visual sx/hearing loss. His sexual contact also had ringworm -- patient's rash came after contact with his friend.  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 He reported he was also tested for syphilis at the rehab and  was told he needed treatment but wanted to wait till this visit. His last treatment was more than a year before this  He last used iv drug 75 days prior to this visit  He has been taking biktarvy  the past 30 days  He would like to have all std screening and anal pap  10/05/24 id clinic f/u See below Recent meth iv relapse and off biktarvy  Needs to see finance today   11/22/24 id clinic f/u Treated for gonorrhea and chlamydia just after 10/05/24 clinic  visit. Rpr titer trending down, albeit slowly Not sexually active or ongoing drug use No complaint today Compliant with biktarvy    Allergies  Allergen Reactions   Ibuprofen      Specifically Advil  the coating causes some swelling      Outpatient Medications Prior to Visit  Medication Sig Dispense Refill   bictegravir-emtricitabine -tenofovir  AF (BIKTARVY ) 50-200-25 MG TABS tablet Take 1 tablet by mouth daily. 30 tablet 11   doxycycline  (VIBRA -TABS) 100 MG tablet Take 2 tablet ASAP within 3 days of sex exposure to reduce std risk 60 tablet 2   terbinafine  (LAMISIL  AT) 1 % cream Apply 1 application topically 2 (two) times daily. (Patient not taking: Reported on 11/22/2024) 30 g 0   No facility-administered medications prior to visit.     Social History   Socioeconomic History   Marital status: Married    Spouse name: Not on file   Number of children: Not on file   Years of education: Not on file   Highest education level: Not on file  Occupational History   Not on file  Tobacco Use   Smoking status: Never   Smokeless tobacco: Never  Substance and Sexual Activity   Alcohol use: Not Currently    Comment: 3-4 drinks a week   Drug use: No   Sexual activity: Yes    Partners: Male    Birth control/protection: Condom  Other Topics Concern   Not on file  Social History Narrative   Not on file   Social Drivers of Health   Tobacco Use: Low Risk (10/05/2024)   Patient History    Smoking Tobacco Use: Never    Smokeless Tobacco Use: Never    Passive Exposure: Not on file  Financial Resource Strain: Low Risk (10/10/2022)   Received from [facility]   Overall Financial Resource Strain (CARDIA)    Difficulty of Paying Living Expenses: Not hard at all  Food Insecurity: Not on file  Transportation Needs: No Transportation Needs (10/10/2022)   Received from [facility] - Transportation    Lack of Transportation (Medical): No    Lack of Transportation  (Non-Medical): No  Physical Activity: Not on file  Stress: No Stress Concern Present (10/10/2022)   Received from [facility] - Occupational Stress Questionnaire    Feeling of Stress : Not at all  Social Connections: Unknown (10/10/2022)   Received from [facility]   Social Network    Social Network: Not on file  Intimate Partner Violence: Unknown (10/10/2022)   Received from [facility]   HITS    Physically Hurt: Not on file    Insult or Talk Down To: Not on file    Threaten Physical Harm: Not on file    Scream or Curse: Not on file  Depression (PHQ2-9): Low Risk (11/22/2024)   Depression (PHQ2-9)    PHQ-2 Score: 1  Alcohol Screen: Not on file  Housing: Not on  file  Utilities: Not on file  Health Literacy: Not on file      Review of Systems    All other ros negative   Objective:    BP (!) 154/89 (BP Location: Left Arm, Patient Position: Sitting, Cuff Size: Normal)   Pulse 81   Temp 98 F (36.7 C) (Oral)   Ht 6' (1.829 m)   Wt 207 lb 9.6 oz (94.2 kg)   SpO2 97%   BMI 28.16 kg/m  Nursing note and vital signs reviewed.  Physical Exam  General/constitutional: no distress, pleasant HEENT: Normocephalic, PER, Conj Clear, EOMI, Oropharynx clear Neck supple CV: rrr no mrg Lungs: clear to auscultation, normal respiratory effort Abd: Soft, Nontender Ext: no edema Skin: No Rash on trunk/body; facial acne type rash Neuro: nonfocal MSK: no peripheral joint swelling/tenderness/warmth; back spines nontender              Labs: Lab Results  Component Value Date   WBC 9.0 10/05/2024   HGB 15.7 10/05/2024   HCT 46.1 10/05/2024   MCV 87.6 10/05/2024   PLT 187 10/05/2024   Last metabolic panel Lab Results  Component Value Date   GLUCOSE 87 10/05/2024   NA 138 10/05/2024   K 4.3 10/05/2024   CL 101 10/05/2024   CO2 29 10/05/2024   BUN 15 10/05/2024   CREATININE 0.96 10/05/2024   EGFR 111 09/08/2023    CALCIUM 9.4 10/05/2024   PROT 8.6 (H) 10/05/2024   ALBUMIN 3.6 07/08/2023   BILITOT 0.8 10/05/2024   ALKPHOS 98 07/08/2023   AST 191 (H) 10/05/2024   ALT 135 (H) 10/05/2024   ANIONGAP 11 07/08/2023    HIV: Lab Results  Component Value Date   HIV1RNAQUANT 162,000 (H) 10/05/2024   Lab Results  Component Value Date   CD4TCELL 11 (L) 10/05/2024   CD4TABS 470 10/05/2024     Micro:  Serology:  Imaging:  Assessment & Plan:   Problem List Items Addressed This Visit       Other   HIV disease (HCC)   Relevant Medications   bictegravir-emtricitabine -tenofovir  AF (BIKTARVY ) 50-200-25 MG TABS tablet   Other Relevant Orders   HIV 1 RNA quant-no reflex-bld   COMPLETE METABOLIC PANEL WITHOUT GFR   CBC   Other Visit Diagnoses       Screening for STDs (sexually transmitted diseases)    -  Primary   Relevant Orders   Urine cytology ancillary only   Cytology (oral, anal, urethral) ancillary only   Cytology (oral, anal, urethral) ancillary only     History of syphilis       Relevant Orders   RPR     Chronic hepatitis C with hepatic coma (HCC)       Relevant Medications   bictegravir-emtricitabine -tenofovir  AF (BIKTARVY ) 50-200-25 MG TABS tablet   Other Relevant Orders   US  ABDOMEN COMPLETE W/ELASTOGRAPHY     Need for pneumococcal 20-valent conjugate vaccination       Relevant Orders   Pneumococcal conjugate vaccine 20-valent     TB (pulmonary tuberculosis)       Relevant Medications   bictegravir-emtricitabine -tenofovir  AF (BIKTARVY ) 50-200-25 MG TABS tablet   Other Relevant Orders   QuantiFERON-TB Gold Plus        #hiv Noncompliant in past Inquires about cabenuva -- at this time not a good candidate but could change in future  10/05/24 ran out of biktarvy  for the past month... when he was taking it he was fairly good on daily use. 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  Rpr titer  10/2024 @ 1:16   09/2023  @ 1:32 11/2020  @ 1:64  10/2024 chlamydia/gonorrehea infection s/p doxy/ceftriaxone  11/2024 reports no sexual activity since last visit  -triple screen and rpr titer again -continue doxy pep   #hep c #ivdu 2021 testing negative 07/2023 drug rehab testing positive; repeat testing 10/2024 continued positive viral load Clinically non-cirrhotic  Gt3 10/2024; tx naive Fib4 @ 10/2024 3.6 rather high; will get elastography No etoh use relapsed iv meth 09/2024 was in rehab  -elastography -will (after elastography) refer to pharmacy for 8 weeks mavyret or 12 weeks epclusa -no obvious potential ddi   -discussed natural progression of hep c, transmission (avoid sharing personal hygiene equipment) -discussed avoid toxin like etoh and excessive acetamaminphen (no more than 2 gram a day) -discussed healthy life style and good glucose control -discussed avoiding eating raw sea food -discussed we can treat hep c but can be reinfected -discussed hepatitis coinfection and vaccination   #hcm -vaccination Tdap 2022 Prevnar 20 today 11/22/24 -hepatitis 10/2024 hep c rna elevated (chronic) 2021 hep b sAb positive -std screen See above -tb screen Will order quantiferon gold 11/2024 -cancer Anal pap today 09/08/23 -- ascus but negative hpv; will repeat in 01/2025 visit   No orders of the defined types were placed in this encounter.       Follow-up: Return in about 2 months (around 01/20/2025).      Dalton ONEIDA Passer, MD [facility] for Infectious Disease [facility] [phone]   pager   [phone] cell 11/22/2024, 8:54 AM  "

## 2024-11-23 LAB — CYTOLOGY, (ORAL, ANAL, URETHRAL) ANCILLARY ONLY
Chlamydia: NEGATIVE
Chlamydia: NEGATIVE
Comment: NEGATIVE
Comment: NEGATIVE
Comment: NORMAL
Comment: NORMAL
Neisseria Gonorrhea: NEGATIVE
Neisseria Gonorrhea: NEGATIVE

## 2024-11-23 LAB — URINE CYTOLOGY ANCILLARY ONLY
Chlamydia: NEGATIVE
Comment: NEGATIVE
Comment: NEGATIVE
Comment: NORMAL
Neisseria Gonorrhea: NEGATIVE
Trichomonas: NEGATIVE

## 2024-11-29 ENCOUNTER — Ambulatory Visit (HOSPITAL_COMMUNITY)
Admission: RE | Admit: 2024-11-29 | Discharge: 2024-11-29 | Disposition: A | Discharge status: Home / Self Care | Source: Ambulatory Visit | Attending: Internal Medicine | Admitting: Internal Medicine

## 2024-11-29 DIAGNOSIS — B182 Chronic viral hepatitis C: Secondary | ICD-10-CM | POA: Diagnosis present

## 2024-11-29 LAB — QUANTIFERON-TB GOLD PLUS
Mitogen-NIL: 6.23 [IU]/mL
NIL: 0.06 [IU]/mL
QuantiFERON-TB Gold Plus: NEGATIVE
TB1-NIL: 0 [IU]/mL
TB2-NIL: 0 [IU]/mL

## 2024-11-29 LAB — CBC
HCT: 44.3 % (ref 39.4–51.1)
Hemoglobin: 15 g/dL (ref 13.2–17.1)
MCH: 29.1 pg (ref 27.0–33.0)
MCHC: 33.9 g/dL (ref 31.6–35.4)
MCV: 86 fL (ref 81.4–101.7)
MPV: 10.2 fL (ref 7.5–12.5)
Platelets: 170 10*3/uL (ref 140–400)
RBC: 5.15 Million/uL (ref 4.20–5.80)
RDW: 13.3 % (ref 11.0–15.0)
WBC: 6.2 10*3/uL (ref 3.8–10.8)

## 2024-11-29 LAB — COMPLETE METABOLIC PANEL WITHOUT GFR
AG Ratio: 1.2 (calc) (ref 1.0–2.5)
ALT: 80 U/L — ABNORMAL HIGH (ref 9–46)
AST: 90 U/L — ABNORMAL HIGH (ref 10–40)
Albumin: 4.2 g/dL (ref 3.6–5.1)
Alkaline phosphatase (APISO): 75 U/L (ref 36–130)
BUN: 20 mg/dL (ref 7–25)
CO2: 29 mmol/L (ref 20–32)
Calcium: 9.4 mg/dL (ref 8.6–10.3)
Chloride: 105 mmol/L (ref 98–110)
Creat: 1.05 mg/dL (ref 0.60–1.29)
Globulin: 3.6 g/dL (ref 1.9–3.7)
Glucose, Bld: 76 mg/dL (ref 65–99)
Potassium: 4 mmol/L (ref 3.5–5.3)
Sodium: 138 mmol/L (ref 135–146)
Total Bilirubin: 0.6 mg/dL (ref 0.2–1.2)
Total Protein: 7.8 g/dL (ref 6.1–8.1)

## 2024-11-29 LAB — HIV-1 RNA QUANT-NO REFLEX-BLD
HIV 1 RNA Quant: 64 {copies}/mL — ABNORMAL HIGH
HIV-1 RNA Quant, Log: 1.81 {Log_copies}/mL — ABNORMAL HIGH

## 2024-11-29 LAB — TEST AUTHORIZATION: TEST NAME:: 36126

## 2024-11-29 LAB — T PALLIDUM AB: T Pallidum Abs: POSITIVE — AB

## 2024-11-29 LAB — RPR TITER: RPR Titer: 1:16 {titer} — ABNORMAL HIGH

## 2024-11-29 LAB — SYPHILIS: RPR W/REFLEX TO RPR TITER AND TREPONEMAL ANTIBODIES, TRADITIONAL SCREENING AND DIAGNOSIS ALGORITHM: RPR Ser Ql: REACTIVE — AB

## 2024-12-01 ENCOUNTER — Other Ambulatory Visit (HOSPITAL_COMMUNITY): Payer: Self-pay

## 2024-12-01 ENCOUNTER — Ambulatory Visit: Payer: Self-pay | Admitting: Internal Medicine

## 2024-12-01 NOTE — Progress Notes (Signed)
 Let's try for Mavyret first since it's a shorter course.

## 2024-12-01 NOTE — Progress Notes (Signed)
 Sounds good Dr. Overton!   Dalton Rodriguez/Dalton Rodriguez - does his insurance have preference of Mavyret versus Epclusa? Either will work for him.

## 2024-12-06 ENCOUNTER — Other Ambulatory Visit (HOSPITAL_COMMUNITY): Payer: Self-pay

## 2024-12-06 ENCOUNTER — Telehealth: Payer: Self-pay

## 2024-12-06 NOTE — Telephone Encounter (Signed)
 Submitted a Prior Authorization request to [facility] for Mavyret via Latent. Will update once we receive a response.   PA ID: [id number]

## 2025-01-05 ENCOUNTER — Other Ambulatory Visit

## 2025-01-19 ENCOUNTER — Ambulatory Visit: Payer: Self-pay | Admitting: Internal Medicine

## 2025-01-26 ENCOUNTER — Ambulatory Visit: Admitting: Internal Medicine
